# Patient Record
Sex: Female | Born: 1955 | Race: White | Hispanic: No | Marital: Married | Smoking: Current some day smoker
Health system: Southern US, Community
[De-identification: ages and names within clinical notes are randomized; demographics above are authoritative.]

## PROBLEM LIST (undated history)

## (undated) DIAGNOSIS — D125 Benign neoplasm of sigmoid colon: Secondary | ICD-10-CM

## (undated) DIAGNOSIS — T4145XA Adverse effect of unspecified anesthetic, initial encounter: Secondary | ICD-10-CM

## (undated) DIAGNOSIS — R112 Nausea with vomiting, unspecified: Secondary | ICD-10-CM

## (undated) DIAGNOSIS — Z87442 Personal history of urinary calculi: Secondary | ICD-10-CM

## (undated) DIAGNOSIS — E041 Nontoxic single thyroid nodule: Secondary | ICD-10-CM

## (undated) DIAGNOSIS — Z9889 Other specified postprocedural states: Secondary | ICD-10-CM

## (undated) DIAGNOSIS — E079 Disorder of thyroid, unspecified: Secondary | ICD-10-CM

## (undated) DIAGNOSIS — M1990 Unspecified osteoarthritis, unspecified site: Secondary | ICD-10-CM

## (undated) DIAGNOSIS — Z86010 Personal history of colonic polyps: Secondary | ICD-10-CM

## (undated) DIAGNOSIS — Z860101 Personal history of adenomatous and serrated colon polyps: Secondary | ICD-10-CM

## (undated) DIAGNOSIS — K589 Irritable bowel syndrome without diarrhea: Secondary | ICD-10-CM

## (undated) DIAGNOSIS — N189 Chronic kidney disease, unspecified: Secondary | ICD-10-CM

## (undated) DIAGNOSIS — T8859XA Other complications of anesthesia, initial encounter: Secondary | ICD-10-CM

## (undated) DIAGNOSIS — N200 Calculus of kidney: Secondary | ICD-10-CM

## (undated) DIAGNOSIS — E785 Hyperlipidemia, unspecified: Secondary | ICD-10-CM

## (undated) DIAGNOSIS — I456 Pre-excitation syndrome: Secondary | ICD-10-CM

## (undated) DIAGNOSIS — Z8489 Family history of other specified conditions: Secondary | ICD-10-CM

## (undated) DIAGNOSIS — T7840XA Allergy, unspecified, initial encounter: Secondary | ICD-10-CM

## (undated) HISTORY — DX: Allergy, unspecified, initial encounter: T78.40XA

## (undated) HISTORY — PX: HYSTEROSCOPY: SHX211

## (undated) HISTORY — DX: Disorder of thyroid, unspecified: E07.9

## (undated) HISTORY — PX: COLONOSCOPY: SHX174

## (undated) HISTORY — DX: Calculus of kidney: N20.0

## (undated) HISTORY — DX: Irritable bowel syndrome, unspecified: K58.9

## (undated) HISTORY — DX: Personal history of adenomatous and serrated colon polyps: Z86.0101

## (undated) HISTORY — PX: HEMORRHOID BANDING: SHX5850

## (undated) HISTORY — DX: Benign neoplasm of sigmoid colon: D12.5

## (undated) HISTORY — DX: Personal history of colonic polyps: Z86.010

## (undated) HISTORY — DX: Hyperlipidemia, unspecified: E78.5

## (undated) HISTORY — PX: DILATION AND CURETTAGE OF UTERUS: SHX78

---

## 1998-05-03 ENCOUNTER — Other Ambulatory Visit: Admission: RE | Admit: 1998-05-03 | Discharge: 1998-05-03 | Payer: Self-pay | Admitting: Obstetrics and Gynecology

## 2000-05-07 ENCOUNTER — Other Ambulatory Visit: Admission: RE | Admit: 2000-05-07 | Discharge: 2000-05-07 | Payer: Self-pay | Admitting: Obstetrics and Gynecology

## 2004-10-30 ENCOUNTER — Ambulatory Visit: Payer: Self-pay | Admitting: *Deleted

## 2004-11-05 ENCOUNTER — Ambulatory Visit: Payer: Self-pay | Admitting: *Deleted

## 2004-11-09 ENCOUNTER — Ambulatory Visit: Payer: Self-pay | Admitting: *Deleted

## 2004-11-16 ENCOUNTER — Ambulatory Visit: Payer: Self-pay | Admitting: *Deleted

## 2004-11-26 ENCOUNTER — Ambulatory Visit: Payer: Self-pay | Admitting: *Deleted

## 2004-12-05 ENCOUNTER — Other Ambulatory Visit: Admission: RE | Admit: 2004-12-05 | Discharge: 2004-12-05 | Payer: Self-pay | Admitting: Family Medicine

## 2004-12-05 ENCOUNTER — Ambulatory Visit: Payer: Self-pay | Admitting: Family Medicine

## 2004-12-10 ENCOUNTER — Ambulatory Visit: Payer: Self-pay | Admitting: *Deleted

## 2007-12-16 ENCOUNTER — Encounter: Payer: Self-pay | Admitting: Family Medicine

## 2008-05-09 ENCOUNTER — Encounter: Payer: Self-pay | Admitting: Family Medicine

## 2008-06-13 ENCOUNTER — Telehealth (INDEPENDENT_AMBULATORY_CARE_PROVIDER_SITE_OTHER): Payer: Self-pay | Admitting: *Deleted

## 2008-06-13 ENCOUNTER — Encounter (INDEPENDENT_AMBULATORY_CARE_PROVIDER_SITE_OTHER): Payer: Self-pay | Admitting: *Deleted

## 2008-06-13 LAB — CONVERTED CEMR LAB
Bilirubin Urine: NEGATIVE
WBC Urine, dipstick: NEGATIVE

## 2008-06-14 ENCOUNTER — Ambulatory Visit: Payer: Self-pay | Admitting: Family Medicine

## 2008-06-15 ENCOUNTER — Encounter: Payer: Self-pay | Admitting: Family Medicine

## 2008-06-16 ENCOUNTER — Ambulatory Visit: Payer: Self-pay | Admitting: Family Medicine

## 2008-06-16 DIAGNOSIS — K649 Unspecified hemorrhoids: Secondary | ICD-10-CM

## 2008-06-17 ENCOUNTER — Encounter (INDEPENDENT_AMBULATORY_CARE_PROVIDER_SITE_OTHER): Payer: Self-pay | Admitting: *Deleted

## 2008-06-17 ENCOUNTER — Encounter: Payer: Self-pay | Admitting: Family Medicine

## 2008-06-20 ENCOUNTER — Ambulatory Visit: Payer: Self-pay | Admitting: Family Medicine

## 2008-06-20 ENCOUNTER — Ambulatory Visit: Payer: Self-pay | Admitting: Gastroenterology

## 2008-06-20 LAB — CONVERTED CEMR LAB
Bilirubin Urine: NEGATIVE
Glucose, Urine, Semiquant: NEGATIVE
pH: 5

## 2008-06-21 ENCOUNTER — Encounter: Admission: RE | Admit: 2008-06-21 | Discharge: 2008-06-21 | Payer: Self-pay | Admitting: Family Medicine

## 2008-06-22 ENCOUNTER — Ambulatory Visit: Payer: Self-pay | Admitting: Gastroenterology

## 2008-06-24 ENCOUNTER — Encounter (INDEPENDENT_AMBULATORY_CARE_PROVIDER_SITE_OTHER): Payer: Self-pay | Admitting: *Deleted

## 2008-06-27 ENCOUNTER — Telehealth (INDEPENDENT_AMBULATORY_CARE_PROVIDER_SITE_OTHER): Payer: Self-pay | Admitting: *Deleted

## 2008-06-30 ENCOUNTER — Encounter: Payer: Self-pay | Admitting: Family Medicine

## 2008-07-03 LAB — CONVERTED CEMR LAB
ALT: 12 units/L (ref 0–35)
Albumin: 4 g/dL (ref 3.5–5.2)
Alkaline Phosphatase: 52 units/L (ref 39–117)
Bilirubin, Direct: 0.1 mg/dL (ref 0.0–0.3)
CO2: 31 meq/L (ref 19–32)
Chloride: 106 meq/L (ref 96–112)
Creatinine, Ser: 0.7 mg/dL (ref 0.4–1.2)
Eosinophils Absolute: 0.1 10*3/uL (ref 0.0–0.7)
GFR calc non Af Amer: 93 mL/min
HCT: 39.1 % (ref 36.0–46.0)
HDL: 58.5 mg/dL (ref >39.0)
Hemoglobin: 13.5 g/dL (ref 12.0–15.0)
Lymphocytes Relative: 30.2 % (ref 12.0–46.0)
MCHC: 34.5 g/dL (ref 30.0–36.0)
MCV: 90.9 fL (ref 78.0–100.0)
Monocytes Relative: 8.9 % (ref 3.0–12.0)
Platelets: 211 10*3/uL (ref 150–400)
RDW: 12 % (ref 11.5–14.6)
TSH: 1.76 microintl units/mL (ref 0.35–5.50)
Total Protein: 6.5 g/dL (ref 6.0–8.3)
VLDL: 19 mg/dL (ref 0–40)

## 2008-07-04 ENCOUNTER — Encounter: Admission: RE | Admit: 2008-07-04 | Discharge: 2008-07-04 | Payer: Self-pay | Admitting: Family Medicine

## 2008-07-06 ENCOUNTER — Telehealth: Payer: Self-pay | Admitting: Family Medicine

## 2008-07-14 ENCOUNTER — Ambulatory Visit: Payer: Self-pay | Admitting: Family Medicine

## 2008-07-14 DIAGNOSIS — E785 Hyperlipidemia, unspecified: Secondary | ICD-10-CM | POA: Insufficient documentation

## 2008-07-18 ENCOUNTER — Ambulatory Visit: Payer: Self-pay | Admitting: Family Medicine

## 2008-07-19 ENCOUNTER — Telehealth: Payer: Self-pay | Admitting: Gastroenterology

## 2008-07-25 ENCOUNTER — Encounter (INDEPENDENT_AMBULATORY_CARE_PROVIDER_SITE_OTHER): Payer: Self-pay | Admitting: *Deleted

## 2008-08-02 ENCOUNTER — Ambulatory Visit: Payer: Self-pay | Admitting: Family Medicine

## 2008-08-02 ENCOUNTER — Encounter (INDEPENDENT_AMBULATORY_CARE_PROVIDER_SITE_OTHER): Payer: Self-pay | Admitting: *Deleted

## 2008-08-03 ENCOUNTER — Encounter: Payer: Self-pay | Admitting: Family Medicine

## 2008-08-26 ENCOUNTER — Telehealth: Payer: Self-pay | Admitting: Family Medicine

## 2008-11-02 ENCOUNTER — Ambulatory Visit: Payer: Self-pay | Admitting: Family Medicine

## 2008-11-03 ENCOUNTER — Encounter (INDEPENDENT_AMBULATORY_CARE_PROVIDER_SITE_OTHER): Payer: Self-pay | Admitting: *Deleted

## 2008-11-14 ENCOUNTER — Encounter (INDEPENDENT_AMBULATORY_CARE_PROVIDER_SITE_OTHER): Payer: Self-pay | Admitting: *Deleted

## 2009-01-30 ENCOUNTER — Encounter: Admission: RE | Admit: 2009-01-30 | Discharge: 2009-01-30 | Payer: Self-pay | Admitting: Family Medicine

## 2009-08-16 ENCOUNTER — Encounter: Payer: Self-pay | Admitting: Family Medicine

## 2009-09-04 ENCOUNTER — Encounter: Admission: RE | Admit: 2009-09-04 | Discharge: 2009-09-04 | Payer: Self-pay | Admitting: Family Medicine

## 2009-09-04 LAB — HM MAMMOGRAPHY

## 2009-09-28 ENCOUNTER — Emergency Department (HOSPITAL_BASED_OUTPATIENT_CLINIC_OR_DEPARTMENT_OTHER): Admission: EM | Admit: 2009-09-28 | Discharge: 2009-09-29 | Payer: Self-pay | Admitting: Emergency Medicine

## 2009-09-28 ENCOUNTER — Ambulatory Visit: Payer: Self-pay | Admitting: Radiology

## 2009-11-06 ENCOUNTER — Telehealth (INDEPENDENT_AMBULATORY_CARE_PROVIDER_SITE_OTHER): Payer: Self-pay | Admitting: *Deleted

## 2009-11-06 ENCOUNTER — Ambulatory Visit: Payer: Self-pay | Admitting: Family Medicine

## 2009-11-06 DIAGNOSIS — I456 Pre-excitation syndrome: Secondary | ICD-10-CM

## 2009-11-07 ENCOUNTER — Encounter (INDEPENDENT_AMBULATORY_CARE_PROVIDER_SITE_OTHER): Payer: Self-pay | Admitting: *Deleted

## 2009-11-10 ENCOUNTER — Telehealth (INDEPENDENT_AMBULATORY_CARE_PROVIDER_SITE_OTHER): Payer: Self-pay | Admitting: *Deleted

## 2009-11-23 ENCOUNTER — Ambulatory Visit: Payer: Self-pay | Admitting: Internal Medicine

## 2009-12-18 ENCOUNTER — Encounter (INDEPENDENT_AMBULATORY_CARE_PROVIDER_SITE_OTHER): Payer: Self-pay | Admitting: *Deleted

## 2009-12-18 ENCOUNTER — Ambulatory Visit: Payer: Self-pay | Admitting: Internal Medicine

## 2009-12-26 ENCOUNTER — Telehealth: Payer: Self-pay | Admitting: Family Medicine

## 2010-01-15 ENCOUNTER — Ambulatory Visit: Payer: Self-pay | Admitting: Internal Medicine

## 2010-01-19 ENCOUNTER — Telehealth (INDEPENDENT_AMBULATORY_CARE_PROVIDER_SITE_OTHER): Payer: Self-pay | Admitting: *Deleted

## 2010-01-29 ENCOUNTER — Ambulatory Visit: Payer: Self-pay | Admitting: Internal Medicine

## 2010-01-29 LAB — HM COLONOSCOPY

## 2010-02-02 ENCOUNTER — Encounter: Payer: Self-pay | Admitting: Internal Medicine

## 2010-03-06 ENCOUNTER — Telehealth (INDEPENDENT_AMBULATORY_CARE_PROVIDER_SITE_OTHER): Payer: Self-pay | Admitting: *Deleted

## 2010-03-09 ENCOUNTER — Ambulatory Visit: Payer: Self-pay | Admitting: Family Medicine

## 2010-03-09 DIAGNOSIS — IMO0001 Reserved for inherently not codable concepts without codable children: Secondary | ICD-10-CM

## 2010-03-12 LAB — CONVERTED CEMR LAB: Total CK: 61 units/L (ref 7–177)

## 2010-03-14 ENCOUNTER — Telehealth (INDEPENDENT_AMBULATORY_CARE_PROVIDER_SITE_OTHER): Payer: Self-pay | Admitting: *Deleted

## 2010-03-14 LAB — CONVERTED CEMR LAB
Alkaline Phosphatase: 72 units/L (ref 39–117)
Cholesterol: 235 mg/dL — ABNORMAL HIGH (ref 0–200)
HDL: 75.4 mg/dL (ref >39.00)
Total Bilirubin: 0.7 mg/dL (ref 0.3–1.2)
Total Protein: 6.8 g/dL (ref 6.0–8.3)

## 2010-03-19 ENCOUNTER — Telehealth (INDEPENDENT_AMBULATORY_CARE_PROVIDER_SITE_OTHER): Payer: Self-pay | Admitting: *Deleted

## 2010-07-01 LAB — CONVERTED CEMR LAB
ALT: 29 units/L (ref 0–35)
ALT: 30 units/L (ref 0–35)
AST: 29 units/L (ref 0–37)
Alkaline Phosphatase: 76 units/L (ref 39–117)
BUN: 19 mg/dL (ref 6–23)
Bilirubin Urine: NEGATIVE
CO2: 32 meq/L (ref 19–32)
Cholesterol: 196 mg/dL (ref 0–200)
Glucose, Urine, Semiquant: NEGATIVE
HCT: 40.6 % (ref 36.0–46.0)
LDL Cholesterol: 101 mg/dL — ABNORMAL HIGH (ref 0–99)
Lymphocytes Relative: 30.6 % (ref 12.0–46.0)
Lymphs Abs: 2 10*3/uL (ref 0.7–4.0)
MCHC: 34.5 g/dL (ref 30.0–36.0)
Monocytes Relative: 8.6 % (ref 3.0–12.0)
Neutro Abs: 3.8 10*3/uL (ref 1.4–7.7)
Platelets: 221 10*3/uL (ref 150.0–400.0)
Protein, U semiquant: NEGATIVE
Sodium: 144 meq/L (ref 135–145)
Total Bilirubin: 0.5 mg/dL (ref 0.3–1.2)
Total Protein: 6.8 g/dL (ref 6.0–8.3)
Triglycerides: 88 mg/dL (ref 0.0–149.0)
WBC Urine, dipstick: NEGATIVE
pH: 5

## 2010-07-05 NOTE — Progress Notes (Signed)
Summary: leg pain from med  Phone Note Call from Patient Call back at Home Phone [phone]   Caller: Patient Summary of Call: patient was taking simvastatin she had leg pain & switched to lipitor - she is to have lab to follow up since starting   lipitor - she started getting leg pain again - wants to know if she should have lab or does she need ov  .Marland KitchenOkey Regal Spring  March 06, 2010 4:29 PM   please advise Initial call taken by: Almeta Monas CMA Duncan Dull),  March 06, 2010 4:48 PM  Follow-up for Phone Call        try half lipitor every other day ----check cpk for myalgias.   Follow-up by: Loreen Freud DO,  March 06, 2010 5:08 PM  Additional Follow-up for Phone Call Additional follow up Details #1::        Left message to call back  Almeta Monas CMA Duncan Dull)  March 07, 2010 2:59 PM     Additional Follow-up for Phone Call Additional follow up Details #2::    I spoke with patient and schedule appointment with Dr.Lowne to address muscle aches and chlosterol med. Patient aware to come fasting to appointment tomorrow so any necessary labs can be drawn Follow-up by: Shonna Chock CMA,  March 08, 2010 10:04 AM

## 2010-07-05 NOTE — Progress Notes (Signed)
Summary: REFILL REQUEST  Phone Note Refill Request Message from:  Patient on March 19, 2010 1:39 PM  Refills Requested: Medication #1:  LIPITOR 20 MG TABS Take 1 by mouth at bedtime   Dosage confirmed as above?Dosage Confirmed   Supply Requested: 1 month   Notes: PT NEEDS ASAP. DOESNT HAVE ANY PILLS [facility] [address]  Next Appointment Scheduled: NONE Initial call taken by: Lavell Islam,  March 19, 2010 1:40 PM    Prescriptions: LIPITOR 20 MG TABS (ATORVASTATIN CALCIUM) Take 1 by mouth at bedtime  #30 x 2   Entered by:   Almeta Monas CMA (AAMA)   Authorized by:   Loreen Freud DO   Signed by:   Almeta Monas CMA (AAMA) on 03/19/2010   Method used:   Faxed to ...       [facility] [phone]* (retail)       [address]       [place], [place]  [address]       Ph: [phone]       Fax: [phone]   RxID:   [id number]

## 2010-07-05 NOTE — Assessment & Plan Note (Signed)
Summary: Discuss Muscle Aches/Fasting Lab/scm   Vital Signs:  Patient profile:   55 year old female Weight:      107.0 pounds BMI:     20.29 Pulse rate:   80 / minute Pulse rhythm:   regular BP sitting:   102 / 68  (left arm) Cuff size:   small  Vitals Entered By: Almeta Monas CMA Duncan Dull) (March 09, 2010 10:10 AM) CC: c/o leg pain x1wk pt stopped lipitor   History of Present Illness: Pt here c/o leg pain since starting lipitor ---she stopped in about 1 week ago and pain is better but not gone.   no other complaints.  pain worse in R leg.  no errythema, no fevers.    Current Medications (verified): 1)  Lipitor 10 Mg Tabs (Atorvastatin Calcium) .Marland Kitchen.. 1 By Mouth Every Other Day 2)  Ultra Choice Multivitamin Kids  Chew (Pediatric Multivit-Minerals-C) .Marland Kitchen.. 1 By Mouth Once Daily 3)  Citracal Plus Heart Health 315-250-200 Mg-Unit-Mg Tabs (Calcium-Vit D3-Phytosterols) .... 2 By Mouth Once Daily 4)  Xylocaine Jelly 2 % Gel (Lidocaine Hcl) .... Apply Small Amount Every 2-3 Hours As Needed For Painful Hemorrhoids During Colon Prep  Allergies (verified): No Known Drug Allergies  Past History:  Past medical, surgical, family and social histories (including risk factors) reviewed for relevance to current acute and chronic problems.  Past Medical History: Reviewed history from 12/18/2009 and no changes required. Pre-excitation by EKG HYPERLIPIDEMIA (ICD-272.4) HEMORRHOIDS, EXTERNAL (ICD-455.3) Nephrolithiasis Irritable Bowel Syndrome  Past Surgical History: Reviewed history from 06/22/2008 and no changes required. hemorrhoids banded (Tim Earlene Plater 2005)  Family History: Reviewed history from 06/22/2008 and no changes required. Family History of Skin cancer-- BCC- F Family History of Arthritis F--pacemaker for bradycardia Family History Hypertension mat side--dementia Family History Lung cancer M --osteopenia M--colon polyps Family History High cholesterol M-- PMR,  Mac  degeneration,  heart murmur Family History Diabetes 1st degree relative F--glaucoma, cataracts, diverticulitis,  carotid enarterectomy MGM---lived to be 100    Social History: Reviewed history from 11/23/2009 and no changes required. Occupation:  housewife Married and lives in [place], [place]. Two Grown children. Current Smoker, < 1/2PPD, trying to quit Alcohol use-no Drug use-no Regular exercise-yes   Review of Systems      See HPI  Physical Exam  General:  Well-developed,well-nourished,in no acute distress; alert,appropriate and cooperative throughout examination Extremities:  No clubbing, cyanosis, edema, or deformity noted with normal full range of motion of all joints.   Skin:  Intact without suspicious lesions or rashes Psych:  Cognition and judgment appear intact. Alert and cooperative with normal attention span and concentration. No apparent delusions, illusions, hallucinations   Impression & Recommendations:  Problem # 1:  MYALGIA (ICD-729.1) stop lipitor  check cpk if negative---start lipitor 10 mg 1/2 by mouth evry other day after pain subsides  Complete Medication List: 1)  Lipitor 10 Mg Tabs (Atorvastatin calcium) .Marland Kitchen.. 1 by mouth every other day 2)  Ultra Choice Multivitamin Kids Chew (Pediatric multivit-minerals-c) .Marland Kitchen.. 1 by mouth once daily 3)  Citracal Plus Heart Health 315-250-200 Mg-unit-mg Tabs (Calcium-vit d3-phytosterols) .... 2 by mouth once daily 4)  Xylocaine Jelly 2 % Gel (Lidocaine hcl) .... Apply small amount every 2-3 hours as needed for painful hemorrhoids during colon prep  Other Orders: Venipuncture ([address]) Specimen Handling ([address]) TLB-CK Total Only(Creatine Kinase/CPK) (82550-CK) TLB-Hepatic/Liver Function Pnl (80076-HEPATIC) TLB-Lipid Panel (80061-LIPID) Prescriptions: LIPITOR 10 MG TABS (ATORVASTATIN CALCIUM) 1 by mouth every other day  #15 x 2  Entered and Authorized by:   Loreen Freud DO   Signed by:   Loreen Freud DO on 03/09/2010    Method used:   Print then Give to Patient   RxID:   [id number]

## 2010-07-05 NOTE — Letter (Signed)
Summary: New Patient letter  [facility]  [address] [place], [place] [address]   Phone: [phone]  Fax: [phone]       11/07/2009 MRN: [id number]  [facility] [address] [place], [place]  [address]  Dear Lauren Sanchez,  Welcome to the Gastroenterology Division at [facility].    You are scheduled to see Dr.  Leone Payor on 12-18-09 at 2:00p.m. on the 3rd floor at [facility], [address]  We ask that you try to arrive at our office 15 minutes prior to your appointment time to allow for check-in.  We would like you to complete the enclosed self-administered evaluation form prior to your visit and bring it with you on the day of your appointment.  We will review it with you.  Also, please bring a complete list of all your medications or, if you prefer, bring the medication bottles and we will list them.  Please bring your insurance card so that we may make a copy of it.  If your insurance requires a referral to see a specialist, please bring your referral form from your primary care physician.  Co-payments are due at the time of your visit and may be paid by cash, check or credit card.     Your office visit will consist of a consult with your physician (includes a physical exam), any laboratory testing he/she may order, scheduling of any necessary diagnostic testing (e.g. x-ray, ultrasound, CT-scan), and scheduling of a procedure (e.g. Endoscopy, Colonoscopy) if required.  Please allow enough time on your schedule to allow for any/all of these possibilities.    If you cannot keep your appointment, please call [phone] to cancel or reschedule prior to your appointment date.  This allows [place] the opportunity to schedule an appointment for another patient in need of care.  If you do not cancel or reschedule by 5 p.m. the business day prior to your appointment date, you will be charged a $50.00 late cancellation/no-show fee.    Thank you for choosing [facility] for your medical needs.  We appreciate the opportunity to care for you.  Please visit [place] at our website  to learn more about our practice.                     Sincerely,                                                             The Gastroenterology Division

## 2010-07-05 NOTE — Assessment & Plan Note (Signed)
Summary: nep/WPW/ok per kelly/jml   Visit Type:  Initial Consult Primary Provider:  Laury Axon   History of Present Illness: Lauren Sanchez is a pleasant 55 yo WF with a h/o hyperlipidemia who presents today for EP consultation regarding pre-excitation seen recently on ekg.  She recently presented to Dr Laury Axon for a routine physical.  She is active and reports doing very well. She reports rare positional dizziness if she stands too quickly, but denies other episodes. She denies symptoms of palpitations, chest pain, shortness of breath, orthopnea, PND, lower extremity edema, presyncope, syncope, or neurologic sequela. During her recent physical evaluation, she was observed to have pre-excitation on 12 lead ekg.  She therefore presents today for EP consultation.  Current Medications (verified): 1)  Lipitor 10 Mg Tabs (Atorvastatin Calcium) .... Every Other Day  Allergies (verified): No Known Drug Allergies  Past History:  Past Medical History: Pre-excitation by EKG HYPERLIPIDEMIA (ICD-272.4) HEMORRHOIDS, EXTERNAL (ICD-455.3) Nephrolithiasis  Family History: Reviewed history from 06/22/2008 and no changes required. 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 Two Grown children. Current Smoker, < 1/2PPD, trying to quit Alcohol use-no Drug use-no Regular exercise-yes   Review of Systems       All systems are reviewed and negative except as listed in the HPI.   N/V diarrhea last night,  recovering from GI illness today  Vital Signs:  Patient profile:    55 year old female Height:      61 inches Weight:      103 pounds BMI:     19.53 Pulse rate:   82 / minute BP sitting:   122 / 80  (left arm)  Vitals Entered By: Laurance Flatten CMA (November 23, 2009 4:21 PM)  Physical Exam  General:  Well developed, well nourished, in no acute distress. Head:  normocephalic and atraumatic Eyes:  PERRLA/EOM intact; conjunctiva and lids normal. Mouth:  Teeth, gums and palate normal. Oral mucosa normal. Neck:  Neck supple, no JVD. No masses, thyromegaly or abnormal cervical nodes. Lungs:  Clear bilaterally to auscultation and percussion. Heart:  Non-displaced PMI, chest non-tender; regular rate and rhythm, S1, S2 without murmurs, rubs or gallops. Carotid upstroke normal, no bruit. Normal abdominal aortic size, no bruits. Femorals normal pulses, no bruits. Pedals normal pulses. No edema, no varicosities. Abdomen:  Bowel sounds positive; abdomen soft and non-tender without masses, organomegaly, or hernias noted. No hepatosplenomegaly. Msk:  Back normal, normal gait. Muscle strength and tone normal. Pulses:  pulses normal in all 4 extremities Extremities:  No clubbing or cyanosis. Neurologic:  Alert and oriented x 3. Skin:  Intact without lesions or rashes. Cervical Nodes:  no significant adenopathy Psych:  Normal affect.   EKG  Procedure date:  11/23/2009  Findings:      sinus rhythm 82 bpm, pre-excitation  Impression & Recommendations:  Problem # 1:  WOLFF (WOLFE)-PARKINSON-WHITE (WPW) SYNDROME (ICD-426.7) The patient presents today for EP consultation regarding pre-excitation on surface ekg.  She denies symptoms of palpitations, presyncope, or syncope.  She does not appear to have WPW syndrome, though she is pre-excited on ekg. We had a long discussion today regarding pre-excitation.  She  is aware that pre-excitation carries a slight increased risk for sudden death, though this is almost always preceeded by symptoms of tachycardia/ palpitation. At  this time, I would recommend a GXT to evaluate the refractoriness/ conduction properties of her accessory pathway. She is instructed to call me if she develops symptoms of SVT/ WPW in the interim.  Problem # 2:  HYPERLIPIDEMIA (ICD-272.4)  Zocor recently stopped due to myalgias She is now tolerating lipitor 10mg  every other day  Her updated medication list for this problem includes:    Lipitor 10 Mg Tabs (Atorvastatin calcium) ..... Every other day  Her updated medication list for this problem includes:    Lipitor 10 Mg Tabs (Atorvastatin calcium) ..... Every other day  Other Orders: Treadmill (Treadmill)  Patient Instructions: 1)  Your physician recommends that you schedule a follow-up appointment in: next avaliable with Dr Johney Frame for a GXT 2)  Your physician has requested that you have an exercise tolerance test.  For further information please visit [email or web address]  Please also follow instruction sheet, as given.

## 2010-07-05 NOTE — Procedures (Signed)
Summary: Colonoscopy  Patient: [name] Note: All result statuses are Final unless otherwise noted.  Tests: (1) Colonoscopy (COL)   COL Colonoscopy           DONE     [facility]     [address]     [place], [place]  [address]           COLONOSCOPY PROCEDURE REPORT           PATIENT:  Lauren Sanchez, Lauren Sanchez  MR#:  [id number]     BIRTHDATE:  04/23/1956, 53 yrs. old  GENDER:  female     ENDOSCOPIST:  Iva Boop, MD, [facility]     REF. BY:  Loreen Freud, DO     PROCEDURE DATE:  01/29/2010     PROCEDURE:  Colonoscopy with snare polypectomy     ASA CLASS:  Class I     INDICATIONS:  Routine Risk Screening     MEDICATIONS:   Fentanyl 75 mcg IV, Versed 9 mg IV           DESCRIPTION OF PROCEDURE:   After the risks benefits and     alternatives of the procedure were thoroughly explained, informed     consent was obtained.  Digital rectal exam was performed and     revealed no abnormalities.   The LB [id number]-[id number] endoscope     was introduced through the anus and advanced to the cecum, which     was identified by both the appendix and ileocecal valve, limited     by a tortuous colon, looping difficulties.    The quality of the     prep was excellent, using MoviPrep.  The instrument was then     slowly withdrawn as the colon was fully examined with good views.     Insertion: 23:55 minutes Withdrawal: 6:47 minutes     <<PROCEDUREIMAGES>>           FINDINGS:  A diminutive polyp was found in the sigmoid colon. It     was 3 mm in size. Polyp was snared without cautery. Retrieval was     successful.   Mild diverticulosis was found in the sigmoid colon.     This was otherwise a normal examination of the colon.   Retroflexed     views in the rectum revealed internal hemorrhoids.    The scope     was then withdrawn from the patient and the procedure completed.           COMPLICATIONS:  None     ENDOSCOPIC IMPRESSION:     1) 3 mm diminutive polyp in the sigmoid colon - removed     2)  Mild diverticulosis in the sigmoid colon     3) Internal hemorrhoids     4) Otherwise normal examination of tortuous colon with excellent     prep     RECOMMENDATIONS:     Would use propofol sedation next time and consider using     gastroscope. She is small and the tortuosity along with body     habitus made passage of the scope difficult.     REPEAT EXAM:  In for Colonoscopy, pending biopsy results.           Iva Boop, MD, [facility]           CC:  Lelon Perla, DO     The Patient           n.  eSIGNED:   Iva Boop at 01/29/2010 04:18 PM           Carollee Leitz, [id number]  Note: An exclamation mark (!) indicates a result that was not dispersed into the flowsheet. Document Creation Date: 01/29/2010 4:18 PM _______________________________________________________________________  (1) Order result status: Final Collection or observation date-time: 01/29/2010 16:08 Requested date-time:  Receipt date-time:  Reported date-time:  Referring Physician:   Ordering Physician: Stan Head [phone]) Specimen Source:  Source: Launa Grill Order Number: [id number] Lab site:   Appended Document: Colonoscopy     Procedures Next Due Date:    Colonoscopy: 02/2020

## 2010-07-05 NOTE — Progress Notes (Signed)
Summary: muscle pain simvastatin  Phone Note Call from Patient Call back at Home Phone [phone]   Caller: Patient Summary of Call: pt called on simvastatin and has been experiencing muscle pain on this med .Kandice Hams  November 10, 2009 2:58 PM  Initial call taken by: Kandice Hams,  November 10, 2009 2:58 PM  Follow-up for Phone Call        stop med---  give samples of lipitor 10 mg-- 1 every other day---don't start for 2 weeks and if muscle aches do not go away don't start new meds--make ov Follow-up by: Loreen Freud DO,  November 10, 2009 3:43 PM  Additional Follow-up for Phone Call Additional follow up Details #1::        Spoke with pt informed of Dr Laury Axon recommendations and samples up front .Kandice Hams  November 10, 2009 4:28 PM  Additional Follow-up by: Kandice Hams,  November 10, 2009 4:28 PM

## 2010-07-05 NOTE — Progress Notes (Signed)
Summary: lab result   Phone Note Outgoing Call   Call placed by: [facility] CMA,  March 14, 2010 10:01 AM Details for Reason: Ideally your LDL (bad cholesterol) should be <_100___, your HDL (good cholesterol) should be >_40__ and your triglycerides should be< 150.  Diet and exercise will increase HDL and decrease the LDL and triglycerides. Read Dr. Danice Goltz book--Eat Drink and Be Healthy.  Increase Lipitor to 20 mg #30 1 by mouth  at bedtime, 2 refills.   We will recheck labs in _3__ months.  lipid, hep 272.4  Summary of Call: left message to call office...................Marland KitchenFelecia Deloach CMA  March 14, 2010 10:01 AM   Follow-up for Phone Call        Spoke with patient and she is aware to change medication. Will finish her 10mg  pills by taking 2 at night and will call when she needs a refill.  Follow-up by: Harold Barban,  March 14, 2010 10:14 AM    New/Updated Medications: LIPITOR 20 MG TABS (ATORVASTATIN CALCIUM) Take 1 by mouth at bedtime

## 2010-07-05 NOTE — Progress Notes (Signed)
Summary: Switch Provider  Phone Note Call from Patient Call back at Home Phone [phone]   Caller: Patient Call For: Dr. Christella Hartigan Reason for Call: Talk to Nurse Summary of Call: pt would like to switch from Dr. Christella Hartigan to Dr. Leone Payor b/c her husband is a pt. of Dr. Leone Payor Initial call taken by: Karna Christmas,  November 06, 2009 1:02 PM  Follow-up for Phone Call        that is ok with me if it is ok with Dr. Leone Payor Follow-up by: Rachael Fee MD,  November 06, 2009 4:22 PM  Additional Follow-up for Phone Call Additional follow up Details #1::        ok Additional Follow-up by: Iva Boop MD, [facility],  November 07, 2009 12:27 PM    Additional Follow-up for Phone Call Additional follow up Details #2::    Called pt. and L/M of the decision to switch providers and she may call back to sch'd an appt. w/Dr. Leone Payor Follow-up by: Karna Christmas,  November 07, 2009 3:46 PM

## 2010-07-05 NOTE — Letter (Signed)
Summary: Patient Notice-Hyperplastic Polyps  [facility]  [address] [place], [place] [address]   Phone: [phone]  Fax: [phone]        February 02, 2010 MRN: [id number]    [facility] [address] [place], [place]  [address]    Dear Ms. Headrick,  I am pleased to inform you that the colon polyps removed during your recent colonoscopy was found to be hyperplastic.  These types of polyps are NOT pre-cancerous.  It is therefore my recommendation that you have a repeat colonoscopy examination in 10 years for routine colorectal cancer screening.  Should you develop new or worsening symptoms of abdominal pain, bowel habit changes or bleeding from the rectum or bowels, please schedule an evaluation with either your primary care physician or with me.  Please call us if you are having persistent problems or have questions about your condition that have not been fully answered at this time.   Sincerely,  Iva Boop MD, [facility] This letter has been electronically signed by your physician.  Appended Document: Patient Notice-Hyperplastic Polyps letter mailed

## 2010-07-05 NOTE — Assessment & Plan Note (Signed)
Summary: cpx & lab/cbs   Vital Signs:  Patient profile:   55 year old female Height:      61 inches Weight:      106 pounds BMI:     20.10 Pulse rate:   82 / minute Pulse rhythm:   regular BP sitting:   94 / 68  (left arm) Cuff size:   regular  Vitals Entered By: Army Fossa CMA (November 06, 2009 8:35 AM) CC: Pt here for CPX, pt is fasting.   History of Present Illness: Pt here for cpe and labs.  Pt sees gyn for pap.    Preventive Screening-Counseling & Management  Alcohol-Tobacco     Alcohol drinks/day: 0     Smoking Status: current     Smoking Cessation Counseling: yes     Packs/Day: 6-8 cig     Year Started: 1977  off and on     Cans of tobacco/week: no     Passive Smoke Exposure: no  Caffeine-Diet-Exercise     Caffeine use/day: 1     Does Patient Exercise: yes     Type of exercise: walking     Exercise (avg: min/session): <30     Times/week: 7  Hep-HIV-STD-Contraception     HIV Risk: no     Dental Visit-last 6 months yes     Dental Care Counseling: not indicated; dental care within six months     SBE monthly: yes  Safety-Violence-Falls     Seat Belt Use: 100      Sexual History:  currently monogamous.    Current Medications (verified): 1)  Zocor 40 Mg Tabs (Simvastatin) .Marland Kitchen.. 1 By Mouth At Bedtime  Allergies (verified): No Known Drug Allergies  Past History:  Past Medical History: Last updated: 08/02/2008 Hyperlipidemia  Past Surgical History: Last updated: 06/22/2008 hemorrhoids banded [place] 2005)  Family History: Last updated: 06/22/2008 Family History of Skin cancer-- BCC- F Family History of Arthritis F--pacemaker for bradycardia Family History Hypertension mat side--dementia Family History Lung cancer M --osteopenia M--colon polyps Family History High cholesterol M-- PMR,  Mac degeneration,  heart murmur Family History Diabetes 1st degree relative F--glaucoma, cataracts, diverticulitis,  carotid enarterectomy MGM---lived to  be 100    Social History: Last updated: 06/22/2008 Occupation:  none Married Current Smoker Alcohol use-no Drug use-no Regular exercise-yes   Risk Factors: Alcohol Use: 0 (11/06/2009) Caffeine Use: 1 (11/06/2009) Exercise: yes (11/06/2009)  Risk Factors: Smoking Status: current (11/06/2009) Packs/Day: 6-8 cig (11/06/2009) Cans of tobacco/wk: no (11/06/2009) Passive Smoke Exposure: no (11/06/2009)  Family History: Reviewed history from 06/22/2008 and no changes required. Family History of Skin cancer-- BCC- F Family History of Arthritis F--pacemaker for bradycardia Family History Hypertension mat side--dementia Family History Lung cancer M --osteopenia M--colon polyps Family History High cholesterol M-- PMR,  Mac degeneration,  heart murmur Family History Diabetes 1st degree relative F--glaucoma, cataracts, diverticulitis,  carotid enarterectomy MGM---lived to be 100    Social History: Reviewed history from 06/22/2008 and no changes required. Occupation:  none Married Current Smoker Alcohol use-no Drug use-no Regular exercise-yes  Caffeine use/day:  1 Dental Care w/in 6 mos.:  yes Sexual History:  currently monogamous  Review of Systems      See HPI General:  Denies chills, fatigue, fever, loss of appetite, malaise, sleep disorder, sweats, weakness, and weight loss. Eyes:  Denies blurring, discharge, double vision, eye irritation, eye pain, halos, itching, light sensitivity, red eye, vision loss-1 eye, and vision loss-both eyes; optho q1y. ENT:  Denies  decreased hearing, difficulty swallowing, ear discharge, earache, hoarseness, nasal congestion, nosebleeds, postnasal drainage, ringing in ears, sinus pressure, and sore throat. CV:  Denies bluish discoloration of lips or nails, chest pain or discomfort, difficulty breathing at night, difficulty breathing while lying down, fainting, fatigue, leg cramps with exertion, lightheadness, near fainting, palpitations,  shortness of breath with exertion, swelling of feet, swelling of hands, and weight gain. Resp:  Denies chest discomfort, chest pain with inspiration, cough, coughing up blood, excessive snoring, hypersomnolence, morning headaches, pleuritic, shortness of breath, sputum productive, and wheezing. GI:  Complains of hemorrhoids; denies abdominal pain, bloody stools, change in bowel habits, constipation, dark tarry stools, diarrhea, excessive appetite, gas, indigestion, loss of appetite, nausea, vomiting, vomiting blood, and yellowish skin color. GU:  Denies abnormal vaginal bleeding, decreased libido, discharge, dysuria, genital sores, hematuria, incontinence, nocturia, urinary frequency, and urinary hesitancy. MS:  Denies joint pain, joint redness, joint swelling, loss of strength, low back pain, mid back pain, muscle aches, muscle , cramps, muscle weakness, stiffness, and thoracic pain. Derm:  Denies changes in color of skin, changes in nail beds, dryness, excessive perspiration, flushing, hair loss, insect bite(s), itching, lesion(s), poor wound healing, and rash. Neuro:  Denies brief paralysis, difficulty with concentration, disturbances in coordination, falling down, headaches, inability to speak, memory loss, numbness, poor balance, seizures, sensation of room spinning, tingling, tremors, visual disturbances, and weakness. Psych:  Denies alternate hallucination ( auditory/visual), anxiety, depression, easily angered, easily tearful, irritability, mental problems, panic attacks, sense of great danger, suicidal thoughts/plans, thoughts of violence, unusual visions or sounds, and thoughts /plans of harming others. Endo:  Denies cold intolerance, excessive hunger, excessive thirst, excessive urination, heat intolerance, polyuria, and weight change. Heme:  Denies abnormal bruising, bleeding, enlarge lymph nodes, fevers, pallor, and skin discoloration. Allergy:  Denies hives or rash, itching eyes, persistent  infections, seasonal allergies, and sneezing.  Physical Exam  General:  Well-developed,well-nourished,in no acute distress; alert,appropriate and cooperative throughout examination Head:  Normocephalic and atraumatic without obvious abnormalities. No apparent alopecia or balding. Eyes:  vision grossly intact, pupils equal, pupils round, pupils reactive to light, and no injection.   Ears:  External ear exam shows no significant lesions or deformities.  Otoscopic examination reveals clear canals, tympanic membranes are intact bilaterally without bulging, retraction, inflammation or discharge. Hearing is grossly normal bilaterally. Nose:  External nasal examination shows no deformity or inflammation. Nasal mucosa are pink and moist without lesions or exudates. Mouth:  Oral mucosa and oropharynx without lesions or exudates.  Teeth in good repair. Neck:  No deformities, masses, or tenderness noted. Chest Wall:  No deformities, masses, or tenderness noted. Breasts:  gyn Lungs:  Normal respiratory effort, chest expands symmetrically. Lungs are clear to auscultation, no crackles or wheezes. Heart:  normal rate and no murmur.   Abdomen:  Bowel sounds positive,abdomen soft and non-tender without masses, organomegaly or hernias noted. Rectal:  gyn Genitalia:  gyn Msk:  normal ROM, no joint tenderness, no joint swelling, no joint warmth, no redness over joints, no joint deformities, no joint instability, and no crepitation.   Pulses:  R posterior tibial normal, R dorsalis pedis normal, R carotid normal, L posterior tibial normal, L dorsalis pedis normal, and L carotid normal.   Extremities:  No clubbing, cyanosis, edema, or deformity noted with normal full range of motion of all joints.   Neurologic:  alert & oriented X3, strength normal in all extremities, and gait normal.   Skin:  Intact without suspicious lesions or rashes Cervical  Nodes:  No lymphadenopathy noted Axillary Nodes:  No palpable  lymphadenopathy Psych:  Cognition and judgment appear intact. Alert and cooperative with normal attention span and concentration. No apparent delusions, illusions, hallucinations   Impression & Recommendations:  Problem # 1:  PREVENTIVE HEALTH CARE (ICD-V70.0) GHM utd pap/mammo per gyn Orders: Venipuncture ([address]) TLB-Lipid Panel (80061-LIPID) TLB-BMP (Basic Metabolic Panel-BMET) (80048-METABOL) TLB-CBC Platelet - w/Differential (85025-CBCD) TLB-Hepatic/Liver Function Pnl (80076-HEPATIC) TLB-TSH (Thyroid Stimulating Hormone) (84443-TSH) Gastroenterology Referral (GI) EKG w/ Interpretation (93000)  Problem # 2:  WOLFF (WOLFE)-PARKINSON-WHITE (WPW) SYNDROME (ICD-426.7) Assessment: Unchanged  Orders: Cardiology Referral (Cardiology) EKG w/ Interpretation ([address])  Problem # 3:  HYPERLIPIDEMIA (ICD-272.4)  Her updated medication list for this problem includes:    Zocor 40 Mg Tabs (Simvastatin) .Marland Kitchen... 1 by mouth at bedtime  Orders: Venipuncture ([address]) TLB-Lipid Panel (80061-LIPID) TLB-BMP (Basic Metabolic Panel-BMET) (80048-METABOL) TLB-CBC Platelet - w/Differential (85025-CBCD) TLB-Hepatic/Liver Function Pnl (80076-HEPATIC) TLB-TSH (Thyroid Stimulating Hormone) (84443-TSH) EKG w/ Interpretation (93000)  Labs Reviewed: SGOT: 26 (11/02/2008)   SGPT: 29 (11/02/2008)   HDL:58.5 (06/20/2008)  LDL:DEL (06/20/2008)  Chol:257 (06/20/2008)  Trig:94 (06/20/2008)  Problem # 4:  HEMORRHOIDS, EXTERNAL (ICD-455.3) Assessment: Deteriorated  Orders: Gastroenterology Referral (GI)  Complete Medication List: 1)  Zocor 40 Mg Tabs (Simvastatin) .Marland Kitchen.. 1 by mouth at bedtime  Other Orders: Specimen Handling (09811) T-Culture, Urine (91478-29562) UA Dipstick w/o Micro (manual) (13086)   EKG  Procedure date:  11/06/2009  Findings:      Normal sinus rhythm with rate of:  72 bpm   EKG  Procedure date:  11/06/2009  Findings:      WPW     Laboratory Results   Urine  Tests   Date/Time Reported: November 06, 2009 10:37 AM   Routine Urinalysis   Color: orange Appearance: Clear Glucose: negative   (Normal Range: Negative) Bilirubin: negative   (Normal Range: Negative) Ketone: negative   (Normal Range: Negative) Spec. Gravity: >=1.030   (Normal Range: 1.003-1.035) Blood: moderate   (Normal Range: Negative) pH: 5.0   (Normal Range: 5.0-8.0) Protein: negative   (Normal Range: Negative) Urobilinogen: negative   (Normal Range: 0-1) Nitrite: negative   (Normal Range: Negative) Leukocyte Esterace: negative   (Normal Range: Negative)    Comments: Floydene Flock  November 06, 2009 10:37 AM cx sent

## 2010-07-05 NOTE — Progress Notes (Signed)
Summary: med concerns  ---- Converted from flag ---- ---- 12/26/2009 7:58 AM, Shonna Chock CMA wrote: Message left on VM  yesterday after 4:15pm, unable to create phone note due to patient not leaving a DOB and unable to fully make out her name: ? Lauren Sanchez. [phone], please call to discuss lipitor. Patient was given samples and will run out in 1 week-? continue, ? when next labs due ------------------------------   left message to call office.Felecia Deloach CMA  December 26, 2009 11:01 AM    pt states  that she was instructed to take lipitor 10 mg-- 1 every other day in june but want to know when she needs to have labs check and if she needs to increase to everyday or keep the same. pt states that she is currently not having any reaction to med.Felecia Deloach CMA  December 26, 2009 2:30 PM   Pt needs to con't every other day---- recheck labs in August.   yrlowne 7.26/2011 230p  DISCUSS WITH PATIENT.Felecia Deloach CMA  December 27, 2009 9:36 AM

## 2010-07-05 NOTE — Letter (Signed)
Summary: [facility] Instructions  [facility]  [address] [place], [place] [address]   Phone: [phone]  Fax: [phone]       Lauren Sanchez    13-Jul-1955    MRN: [id number]      Procedure Day Dorna Bloom: Duanne Limerick, 01/29/10     Arrival Time: 2:30 PM       Procedure Time: 3:30 PM    Location of Procedure:                    _X_  [facility] (4th Floor)                    PREPARATION FOR COLONOSCOPY WITH MOVIPREP   Starting 5 days prior to your procedure 01/24/10 do not eat nuts, seeds, popcorn, corn, beans, peas,  salads, or any raw vegetables.  Do not take any fiber supplements (e.g. Metamucil, Citrucel, and Benefiber).  THE DAY BEFORE YOUR PROCEDURE         SUNDAY, 01/28/10  1.  Drink clear liquids the entire day-NO SOLID FOOD  2.  Do not drink anything colored red or purple.  Avoid juices with pulp.  No orange juice.  3.  Drink at least 64 oz. (8 glasses) of fluid/clear liquids during the day to prevent dehydration and help the prep work efficiently.  CLEAR LIQUIDS INCLUDE: Water Jello Ice Popsicles Tea (sugar ok, no milk/cream) Powdered fruit flavored drinks Coffee (sugar ok, no milk/cream) Gatorade Juice: apple, white grape, white cranberry  Lemonade Clear bullion, consomm, broth Carbonated beverages (any kind) Strained chicken noodle soup Hard Candy                             4.  In the morning, mix first dose of MoviPrep solution:    Empty 1 Pouch A and 1 Pouch B into the disposable container    Add lukewarm drinking water to the top line of the container. Mix to dissolve    Refrigerate (mixed solution should be used within 24 hrs)  5.  Begin drinking the prep at 5:00 p.m. The MoviPrep container is divided by 4 marks.   Every 15 minutes drink the solution down to the next mark (approximately 8 oz) until the full liter is complete.   6.  Follow completed prep with 16 oz of clear liquid of your choice (Nothing red or purple).  Continue  to drink clear liquids until bedtime.  7.  Before going to bed, mix second dose of MoviPrep solution:    Empty 1 Pouch A and 1 Pouch B into the disposable container    Add lukewarm drinking water to the top line of the container. Mix to dissolve    Refrigerate  THE DAY OF YOUR PROCEDURE      MONDAY, 01/29/10  Beginning at 10:30 a.m. (5 hours before procedure):         1. Every 15 minutes, drink the solution down to the next mark (approx 8 oz) until the full liter is complete.  2. Follow completed prep with 16 oz. of clear liquid of your choice.    3. You may drink clear liquids until 1:30 PM (2 HOURS BEFORE PROCEDURE).  MEDICATION INSTRUCTIONS  Unless otherwise instructed, you should take regular prescription medications with a small sip of water   as early as possible the morning of your procedure.       OTHER INSTRUCTIONS  You  will need a responsible adult at least 55 years of age to accompany you and drive you home.   This person must remain in the waiting room during your procedure.  Wear loose fitting clothing that is easily removed.  Leave jewelry and other valuables at home.  However, you may wish to bring a book to read or  an iPod/MP3 player to listen to music as you wait for your procedure to start.  Remove all body piercing jewelry and leave at home.  Total time from sign-in until discharge is approximately 2-3 hours.  You should go home directly after your procedure and rest.  You can resume normal activities the  day after your procedure.  The day of your procedure you should not:   Drive   Make legal decisions   Operate machinery   Drink alcohol   Return to work  You will receive specific instructions about eating, activities and medications before you leave.  The above instructions have been reviewed and explained to me by   _______________________  I fully understand and can verbalize these instructions _____________________________ Date  _________

## 2010-07-05 NOTE — Assessment & Plan Note (Signed)
Summary: discuss colon//hemorroids--ch.   History of Present Illness Visit Type: Initial Visit Primary GI MD: Rob Bunting MD Primary Provider: Rosana Fret Requesting Provider: Loreen Freud, MD Chief Complaint: Discuss having colonoscopy History of Present Illness:   55 yo ww with chronuic hemorrhoid problems and has never had a screening colonoscopy. She has had yeasr of intermittent swelling, bleeding and prorusion of hemorrhoids. She tried therapy with Analpram last year but did not follow-through with colonoscopy due to concerns over hemorrhoidal irritation and bleeding.  She did have hemorrhoidal ligation at one time in the past with good result but only lasted 3 months.     GI Review of Systems      Denies abdominal pain, acid reflux, belching, bloating, chest pain, dysphagia with liquids, dysphagia with solids, heartburn, loss of appetite, nausea, vomiting, vomiting blood, weight loss, and  weight gain.      Reports hemorrhoids, irritable bowel syndrome, rectal bleeding, and  rectal pain.     Denies anal fissure, black tarry stools, change in bowel habit, constipation, diarrhea, diverticulosis, fecal incontinence, heme positive stool, jaundice, light color stool, and  liver problems.    Current Medications (verified): 1)  Lipitor 10 Mg Tabs (Atorvastatin Calcium) .... Every Other Day 2)  Ultra Choice Multivitamin Kids  Chew (Pediatric Multivit-Minerals-C) .Marland Kitchen.. 1 By Mouth Once Daily 3)  Citracal Plus Heart Health [phone] Mg-Unit-Mg Tabs (Calcium-Vit D3-Phytosterols) .... 2 By Mouth Once Daily  Allergies (verified): No Known Drug Allergies  Past History:  Past Medical History: Pre-excitation by EKG HYPERLIPIDEMIA (ICD-272.4) HEMORRHOIDS, EXTERNAL (ICD-455.3) Nephrolithiasis Irritable Bowel Syndrome  Past Surgical History: Reviewed history from 06/22/2008 and no changes required. hemorrhoids banded ([facility] 2005)  Family History: Reviewed history from  06/22/2008 and no changes required. Family History of Skin cancer-- BCC- F Family History of Arthritis F--pacemaker for bradycardia Family History Hypertension mat side--dementia Family History Lung cancer M --osteopenia M--colon polyps Family History High cholesterol M-- PMR,  Mac degeneration,  heart murmur Family History Diabetes 1st degree relative F--glaucoma, cataracts, diverticulitis,  carotid enarterectomy MGM---lived to be 100    Social History: Reviewed history from 11/23/2009 and no changes required. Occupation:  housewife Married and lives in [place], [place]. Two Grown children. Current Smoker, < 1/2PPD, trying to quit Alcohol use-no Drug use-no Regular exercise-yes   Review of Systems       The patient complains of heart rhythm changes and muscle pains/cramps.         WPW  Vital Signs:  Patient profile:   55 year old female Height:      61 inches Weight:      105 pounds BMI:     19.91 BSA:     1.44 Pulse rate:   80 / minute Pulse rhythm:   regular BP sitting:   82 / 58  (left arm)  Vitals Entered By: Merri Ray CMA Duncan Dull) (December 18, 2009 2:22 PM)  Physical Exam  General:  Well developed, well nourished, no acute distress. Lungs:  Clear throughout to auscultation. Heart:  Regular rate and rhythm; no murmurs, rubs,  or bruits. Abdomen:  Soft, nontender and nondistended. No masses, hepatosplenomegaly or hernias noted. Normal bowel sounds. Rectal:  inspected - prolapsed hemorrhoids female staff present   Impression & Recommendations:  Problem # 1:  HEMORRHOIDS, WITH BLEEDING (ICD-455.8) Assessment Deteriorated surgery vs. banding, will try to accomplish screening colonoscopy first I think PPH surgery may be appropriate for her and we discussed, and a handout was provided. ?'s answered xylocaine jelly  during prep to help with pain.  Problem # 2:  SCREENING COLORECTAL-CANCER (ICD-V76.51) Assessment: New Risks, benefits,and indications of  endoscopic procedure(s) were reviewed with the patient and all questions answered. Will double check to see if any WPW issues i.e possible hypokalemia could ba an issue for her and trigger a problem. Seems unlikely but will query Dr. Johney Frame Orders: Colonoscopy (Colon)  Problem # 3:  WOLFF (WOLFE)-PARKINSON-WHITE (WPW) SYNDROME (ICD-426.7) Assessment: Comment Only will ask Dr. Johney Frame re any precautions with prep having GXT Aug 15  Patient Instructions: 1)  Please pick up your medications at your pharmacy. MOVIPREP, XYLOCAINE 2)  We will see you at your procedure on 01/29/10 3)  [facility] Patient Information Guide given to patient.  4)  Colonoscopy and Flexible Sigmoidoscopy brochure given.  5)  Hemorrhoids brochure given.  6)  Copy sent to : Hillis Range, MD 7)  The medication list was reviewed and reconciled.  All changed / newly prescribed medications were explained.  A complete medication list was provided to the patient / caregiver. Prescriptions: MOVIPREP 100 GM  SOLR (PEG-KCL-NACL-NASULF-NA ASC-C) As per prep instructions.  #1 x 0   Entered by:   Francee Piccolo CMA (AAMA)   Authorized by:   Iva Boop MD, [facility]   Signed by:   Francee Piccolo CMA (AAMA) on 12/18/2009   Method used:   [facility] to        [facility] [phone]* (retail)       [address]       [place], [place]  [address]       Ph: [phone]       Fax: [phone]   RxID:   [id number] XYLOCAINE JELLY 2 % GEL (LIDOCAINE HCL) apply small amount every 2-3 hours as needed for painful hemorrhoids during colon prep  #30 grams x 0   Entered and Authorized by:   Iva Boop MD, [facility]   Signed by:   Iva Boop MD, FACG on 12/18/2009   Method used:   [facility] to        [facility] [phone]* (retail)       [address]       [place], [place]  [address]       Ph: [phone]       Fax: [phone]   RxID:    [id number]   Appended Document: discuss colon//hemorroids--ch. I would recommend routine GI prep, without modification due to pre-excitation seen on EKG  Appended Document: discuss colon//hemorroids--ch. let her know what Dr. Claudia Desanctis oked routine prep  Appended Document: discuss colon//hemorroids--ch. patient aware

## 2010-07-05 NOTE — Progress Notes (Signed)
Summary: SAMPLES  Phone Note Call from Patient   Caller: Patient Summary of Call: PT HUSBAND CAME IN ASK TO PICK UP HER LIPTOR 10 MG Initial call taken by: Freddy Jaksch,  January 19, 2010 11:18 AM  Follow-up for Phone Call        SAMPLE WAS GIVEN TO HUSBAND WHILE WAITING ON HIS SON Follow-up by: Freddy Jaksch,  January 19, 2010 11:26 AM

## 2010-08-21 LAB — URINE CULTURE: Colony Count: 45000

## 2010-08-21 LAB — URINALYSIS, ROUTINE W REFLEX MICROSCOPIC
Bilirubin Urine: NEGATIVE
Glucose, UA: NEGATIVE mg/dL
Ketones, ur: NEGATIVE mg/dL
Nitrite: NEGATIVE
Specific Gravity, Urine: 1.022 (ref 1.005–1.030)
Urobilinogen, UA: 1 mg/dL (ref 0.0–1.0)

## 2010-08-21 LAB — URINE MICROSCOPIC-ADD ON

## 2010-08-23 ENCOUNTER — Other Ambulatory Visit: Payer: Self-pay | Admitting: Family Medicine

## 2010-08-24 MED ORDER — ATORVASTATIN CALCIUM 20 MG PO TABS: 20.0000 mg | ORAL_TABLET | Freq: Every day | ORAL | Status: DC

## 2010-09-04 ENCOUNTER — Other Ambulatory Visit: Payer: Self-pay

## 2010-09-11 ENCOUNTER — Other Ambulatory Visit (INDEPENDENT_AMBULATORY_CARE_PROVIDER_SITE_OTHER): Payer: BLUE CROSS/BLUE SHIELD

## 2010-09-11 DIAGNOSIS — E785 Hyperlipidemia, unspecified: Secondary | ICD-10-CM

## 2010-09-11 LAB — HEPATIC FUNCTION PANEL
ALT: 27 U/L (ref 0–35)
Alkaline Phosphatase: 67 U/L (ref 39–117)
Bilirubin, Direct: 0.1 mg/dL (ref 0.0–0.3)

## 2010-09-11 LAB — LIPID PANEL
Cholesterol: 176 mg/dL (ref 0–200)
HDL: 69.5 mg/dL (ref >39.00)
LDL Cholesterol: 90 mg/dL (ref 0–99)
Total CHOL/HDL Ratio: 3
Triglycerides: 84 mg/dL (ref 0.0–149.0)

## 2010-09-12 ENCOUNTER — Encounter: Payer: Self-pay | Admitting: *Deleted

## 2010-10-03 ENCOUNTER — Other Ambulatory Visit: Payer: Self-pay

## 2010-10-03 MED ORDER — ATORVASTATIN CALCIUM 20 MG PO TABS: 20.0000 mg | ORAL_TABLET | Freq: Every day | ORAL | Status: DC

## 2011-01-17 ENCOUNTER — Other Ambulatory Visit: Payer: Self-pay | Admitting: Family Medicine

## 2011-01-17 DIAGNOSIS — Z1231 Encounter for screening mammogram for malignant neoplasm of breast: Secondary | ICD-10-CM

## 2011-01-18 ENCOUNTER — Ambulatory Visit
Admission: RE | Admit: 2011-01-18 | Discharge: 2011-01-18 | Disposition: A | Discharge status: Home / Self Care | Payer: BLUE CROSS/BLUE SHIELD | Source: Ambulatory Visit | Attending: Family Medicine | Admitting: Family Medicine

## 2011-01-18 DIAGNOSIS — Z1231 Encounter for screening mammogram for malignant neoplasm of breast: Secondary | ICD-10-CM

## 2011-04-29 ENCOUNTER — Other Ambulatory Visit: Payer: Self-pay | Admitting: Family Medicine

## 2011-04-29 MED ORDER — ATORVASTATIN CALCIUM 20 MG PO TABS: 20.0000 mg | ORAL_TABLET | Freq: Every day | ORAL | Status: DC

## 2011-04-29 NOTE — Telephone Encounter (Signed)
Letter mailed to schedule CPX- KP

## 2011-04-29 NOTE — Telephone Encounter (Signed)
The system is down and Rx has been re-faxed Manually     KP

## 2011-04-29 NOTE — Telephone Encounter (Signed)
Addended by: Arnette Norris on: 04/29/2011 04:23 PM   Modules accepted: Orders

## 2011-07-05 ENCOUNTER — Other Ambulatory Visit: Payer: Self-pay | Admitting: Family Medicine

## 2011-07-05 DIAGNOSIS — E785 Hyperlipidemia, unspecified: Secondary | ICD-10-CM

## 2011-07-08 ENCOUNTER — Other Ambulatory Visit (INDEPENDENT_AMBULATORY_CARE_PROVIDER_SITE_OTHER): Payer: BC Managed Care – PPO

## 2011-07-08 DIAGNOSIS — E785 Hyperlipidemia, unspecified: Secondary | ICD-10-CM

## 2011-07-08 LAB — LIPID PANEL
HDL: 65.9 mg/dL (ref >39.00)
LDL Cholesterol: 91 mg/dL (ref 0–99)
VLDL: 19.6 mg/dL (ref 0.0–40.0)

## 2011-07-09 LAB — HEPATIC FUNCTION PANEL
ALT: 24 U/L (ref 0–35)
Total Bilirubin: 0.4 mg/dL (ref 0.3–1.2)

## 2011-07-15 ENCOUNTER — Encounter: Payer: Self-pay | Admitting: Family Medicine

## 2011-07-15 ENCOUNTER — Ambulatory Visit (INDEPENDENT_AMBULATORY_CARE_PROVIDER_SITE_OTHER): Payer: BC Managed Care – PPO | Admitting: Family Medicine

## 2011-07-15 VITALS — BP 108/70 | HR 82 | Temp 98.3°F | Ht 61.0 in | Wt 110.2 lb

## 2011-07-15 DIAGNOSIS — E785 Hyperlipidemia, unspecified: Secondary | ICD-10-CM

## 2011-07-15 DIAGNOSIS — Z0000 Encounter for general adult medical examination without abnormal findings: Secondary | ICD-10-CM

## 2011-07-15 LAB — POCT URINALYSIS DIPSTICK: Glucose, UA: NEGATIVE

## 2011-07-15 MED ORDER — ATORVASTATIN CALCIUM 20 MG PO TABS: 20.0000 mg | ORAL_TABLET | Freq: Every day | ORAL | Status: DC

## 2011-07-15 MED ORDER — VALACYCLOVIR HCL 1 G PO TABS: 1000.0000 mg | ORAL_TABLET | Freq: Two times a day (BID) | ORAL | Status: DC

## 2011-07-15 NOTE — Patient Instructions (Signed)

## 2011-07-15 NOTE — Progress Notes (Signed)
  Subjective:    Lauren Sanchez is a 56 y.o. female here for follow up of dyslipidemia. The patient does not use medications that may worsen dyslipidemias (corticosteroids, progestins, anabolic steroids, diuretics, beta-blockers, amiodarone, cyclosporine, olanzapine). The patient exercises rarely. The patient is not known to have coexisting coronary artery disease.   Cardiac Risk Factors Age > 45-female, > 55-female:  YES  +1  Smoking:   YES  +1  Sig. family hx of CHD*:  YES  +1  Hypertension:   NO  Diabetes:   NO  HDL < 35:   NO  HDL > 59:   YES  -1  Total: 2   *- Sig. family h/o CHD per NCEP = MI or sudden death at <55yo in  father or other 1st-degree female relative, or <65yo in mother or  other 1st-degree female relative  The following portions of the patient's history were reviewed and updated as appropriate: allergies, current medications, past family history, past medical history, past social history, past surgical history and problem list.  Review of Systems Pertinent items are noted in HPI.    Objective:    BP 108/70  Pulse 82  Temp(Src) 98.3 F (36.8 C) (Oral)  Ht 5\' 1"  (1.549 m)  Wt 110 lb 3.2 oz (49.986 kg)  BMI 20.82 kg/m2  SpO2 99% General appearance: alert, cooperative, appears stated age and no distress Neck: no adenopathy, no carotid bruit, no JVD, supple, symmetrical, trachea midline and thyroid not enlarged, symmetric, no tenderness/mass/nodules Lungs: clear to auscultation bilaterally Heart: S1, S2 normal  Lab Review Lab Results  Component Value Date   CHOL 176 07/08/2011   CHOL 176 09/11/2010   CHOL 235* 03/09/2010   HDL 65.90 07/08/2011   HDL 69.50 09/11/2010   HDL 16.10 03/09/2010   LDLDIRECT 137.9 03/09/2010   LDLDIRECT 174.1 06/20/2008      Assessment:    Dyslipidemia as detailed above with 2 CHD risk factors using NCEP scheme above.  Target levels for LDL are: < 100 mg/dl (CHD or "CHD risk equivalent" is present)  Explained to the patient the  respective contributions of genetics, diet, and exercise to lipid levels and the use of medication in severe cases which do not respond to lifestyle alteration. The patient's interest and motivation in making lifestyle changes seems good.    Plan:    The following changes are planned for the next 6 months, at which time the patient will return for repeat fasting lipids:  1. Dietary changes: Increase soluble fiber Plant sterols 2grams per day (e.g. Benecol): yes Reduce saturated fat, "trans" monounsaturated fatty acids, and cholesterol 2. Exercise changes:  Inc walking 3. Other treatment: stop smoking 4. Lipid-lowering medications: lipitor   (Recommended by NCEP after 3-6 mos of dietary therapy & lifestyle modification,  except if CHD is present or LDL well above 190.) 5. Hormone replacement therapy (patient is a postmenopausal  woman): no 6. Screening for secondary causes of dyslipidemias: None indicated 7. Lipid screening for relatives: na 8. Follow up: a few months.  Note: The majority of the visit was spent in counseling on the pathophysiology and treatment of dyslipidemias.

## 2011-07-16 LAB — CBC WITH DIFFERENTIAL/PLATELET
Basophils Relative: 0.6 % (ref 0.0–3.0)
Eosinophils Relative: 1.1 % (ref 0.0–5.0)
Hemoglobin: 13.4 g/dL (ref 12.0–15.0)
Lymphocytes Relative: 39.6 % (ref 12.0–46.0)
MCV: 90.5 fl (ref 78.0–100.0)
Neutrophils Relative %: 51.4 % (ref 43.0–77.0)
RBC: 4.45 Mil/uL (ref 3.87–5.11)
WBC: 7.8 10*3/uL (ref 4.5–10.5)

## 2011-07-16 LAB — BASIC METABOLIC PANEL
Calcium: 9.7 mg/dL (ref 8.4–10.5)
Chloride: 104 mEq/L (ref 96–112)
Creatinine, Ser: 0.6 mg/dL (ref 0.4–1.2)
Sodium: 141 mEq/L (ref 135–145)

## 2011-07-16 LAB — TSH: TSH: 1.36 u[IU]/mL (ref 0.35–5.50)

## 2011-08-26 ENCOUNTER — Ambulatory Visit (INDEPENDENT_AMBULATORY_CARE_PROVIDER_SITE_OTHER): Payer: BC Managed Care – PPO | Admitting: Family Medicine

## 2011-08-26 ENCOUNTER — Encounter: Payer: Self-pay | Admitting: Family Medicine

## 2011-08-26 ENCOUNTER — Other Ambulatory Visit (HOSPITAL_COMMUNITY)
Admission: RE | Admit: 2011-08-26 | Discharge: 2011-08-26 | Disposition: A | Discharge status: Home / Self Care | Payer: BC Managed Care – PPO | Source: Ambulatory Visit | Attending: Family Medicine | Admitting: Family Medicine

## 2011-08-26 VITALS — BP 108/72 | HR 88 | Temp 98.1°F | Ht 61.0 in | Wt 111.0 lb

## 2011-08-26 DIAGNOSIS — E049 Nontoxic goiter, unspecified: Secondary | ICD-10-CM

## 2011-08-26 DIAGNOSIS — Z01419 Encounter for gynecological examination (general) (routine) without abnormal findings: Secondary | ICD-10-CM | POA: Insufficient documentation

## 2011-08-26 DIAGNOSIS — Z0000 Encounter for general adult medical examination without abnormal findings: Secondary | ICD-10-CM

## 2011-08-26 DIAGNOSIS — Z124 Encounter for screening for malignant neoplasm of cervix: Secondary | ICD-10-CM

## 2011-08-26 DIAGNOSIS — Z780 Asymptomatic menopausal state: Secondary | ICD-10-CM

## 2011-08-26 DIAGNOSIS — E785 Hyperlipidemia, unspecified: Secondary | ICD-10-CM

## 2011-08-26 DIAGNOSIS — Z23 Encounter for immunization: Secondary | ICD-10-CM

## 2011-08-26 DIAGNOSIS — Z1239 Encounter for other screening for malignant neoplasm of breast: Secondary | ICD-10-CM

## 2011-08-26 NOTE — Progress Notes (Signed)
Addended by: Arnette Norris on: 08/26/2011 03:58 PM   Modules accepted: Orders

## 2011-08-26 NOTE — Assessment & Plan Note (Signed)
con't meds  Check labs 

## 2011-08-26 NOTE — Patient Instructions (Signed)
Preventive Care for Adults, Female A healthy lifestyle and preventive care can promote health and wellness. Preventive health guidelines for women include the following key practices.  A routine yearly physical is a good way to check with your caregiver about your health and preventive screening. It is a chance to share any concerns and updates on your health, and to receive a thorough exam.   Visit your dentist for a routine exam and preventive care every 6 months. Brush your teeth twice a day and floss once a day. Good oral hygiene prevents tooth decay and gum disease.   The frequency of eye exams is based on your age, health, family medical history, use of contact lenses, and other factors. Follow your caregiver's recommendations for frequency of eye exams.   Eat a healthy diet. Foods like vegetables, fruits, whole grains, low-fat dairy products, and lean protein foods contain the nutrients you need without too many calories. Decrease your intake of foods high in solid fats, added sugars, and salt. Eat the right amount of calories for you.Get information about a proper diet from your caregiver, if necessary.   Regular physical exercise is one of the most important things you can do for your health. Most adults should get at least 150 minutes of moderate-intensity exercise (any activity that increases your heart rate and causes you to sweat) each week. In addition, most adults need muscle-strengthening exercises on 2 or more days a week.   Maintain a healthy weight. The body mass index (BMI) is a screening tool to identify possible weight problems. It provides an estimate of body fat based on height and weight. Your caregiver can help determine your BMI, and can help you achieve or maintain a healthy weight.For adults 20 years and older:   A BMI below 18.5 is considered underweight.   A BMI of 18.5 to 24.9 is normal.   A BMI of 25 to 29.9 is considered overweight.   A BMI of 30 and above is  considered obese.   Maintain normal blood lipids and cholesterol levels by exercising and minimizing your intake of saturated fat. Eat a balanced diet with plenty of fruit and vegetables. Blood tests for lipids and cholesterol should begin at age 20 and be repeated every 5 years. If your lipid or cholesterol levels are high, you are over 50, or you are at high risk for heart disease, you may need your cholesterol levels checked more frequently.Ongoing high lipid and cholesterol levels should be treated with medicines if diet and exercise are not effective.   If you smoke, find out from your caregiver how to quit. If you do not use tobacco, do not start.   If you are pregnant, do not drink alcohol. If you are breastfeeding, be very cautious about drinking alcohol. If you are not pregnant and choose to drink alcohol, do not exceed 1 drink per day. One drink is considered to be 12 ounces (355 mL) of beer, 5 ounces (148 mL) of wine, or 1.5 ounces (44 mL) of liquor.   Avoid use of street drugs. Do not share needles with anyone. Ask for help if you need support or instructions about stopping the use of drugs.   High blood pressure causes heart disease and increases the risk of stroke. Your blood pressure should be checked at least every 1 to 2 years. Ongoing high blood pressure should be treated with medicines if weight loss and exercise are not effective.   If you are 55 to 57   years old, ask your caregiver if you should take aspirin to prevent strokes.   Diabetes screening involves taking a blood sample to check your fasting blood sugar level. This should be done once every 3 years, after age 45, if you are within normal weight and without risk factors for diabetes. Testing should be considered at a younger age or be carried out more frequently if you are overweight and have at least 1 risk factor for diabetes.   Breast cancer screening is essential preventive care for women. You should practice "breast  self-awareness." This means understanding the normal appearance and feel of your breasts and may include breast self-examination. Any changes detected, no matter how small, should be reported to a caregiver. Women in their 20s and 30s should have a clinical breast exam (CBE) by a caregiver as part of a regular health exam every 1 to 3 years. After age 40, women should have a CBE every year. Starting at age 40, women should consider having a mammography (breast X-ray test) every year. Women who have a family history of breast cancer should talk to their caregiver about genetic screening. Women at a high risk of breast cancer should talk to their caregivers about having magnetic resonance imaging (MRI) and a mammography every year.   The Pap test is a screening test for cervical cancer. A Pap test can show cell changes on the cervix that might become cervical cancer if left untreated. A Pap test is a procedure in which cells are obtained and examined from the lower end of the uterus (cervix).   Women should have a Pap test starting at age 21.   Between ages 21 and 29, Pap tests should be repeated every 2 years.   Beginning at age 30, you should have a Pap test every 3 years as long as the past 3 Pap tests have been normal.   Some women have medical problems that increase the chance of getting cervical cancer. Talk to your caregiver about these problems. It is especially important to talk to your caregiver if a new problem develops soon after your last Pap test. In these cases, your caregiver may recommend more frequent screening and Pap tests.   The above recommendations are the same for women who have or have not gotten the vaccine for human papillomavirus (HPV).   If you had a hysterectomy for a problem that was not cancer or a condition that could lead to cancer, then you no longer need Pap tests. Even if you no longer need a Pap test, a regular exam is a good idea to make sure no other problems are  starting.   If you are between ages 65 and 70, and you have had normal Pap tests going back 10 years, you no longer need Pap tests. Even if you no longer need a Pap test, a regular exam is a good idea to make sure no other problems are starting.   If you have had past treatment for cervical cancer or a condition that could lead to cancer, you need Pap tests and screening for cancer for at least 20 years after your treatment.   If Pap tests have been discontinued, risk factors (such as a new sexual partner) need to be reassessed to determine if screening should be resumed.   The HPV test is an additional test that may be used for cervical cancer screening. The HPV test looks for the virus that can cause the cell changes on the cervix.   The cells collected during the Pap test can be tested for HPV. The HPV test could be used to screen women aged 30 years and older, and should be used in women of any age who have unclear Pap test results. After the age of 30, women should have HPV testing at the same frequency as a Pap test.   Colorectal cancer can be detected and often prevented. Most routine colorectal cancer screening begins at the age of 50 and continues through age 75. However, your caregiver may recommend screening at an earlier age if you have risk factors for colon cancer. On a yearly basis, your caregiver may provide home test kits to check for hidden blood in the stool. Use of a small camera at the end of a tube, to directly examine the colon (sigmoidoscopy or colonoscopy), can detect the earliest forms of colorectal cancer. Talk to your caregiver about this at age 50, when routine screening begins. Direct examination of the colon should be repeated every 5 to 10 years through age 75, unless early forms of pre-cancerous polyps or small growths are found.   Hepatitis C blood testing is recommended for all people born from 1945 through 1965 and any individual with known risks for hepatitis C.    Practice safe sex. Use condoms and avoid high-risk sexual practices to reduce the spread of sexually transmitted infections (STIs). STIs include gonorrhea, chlamydia, syphilis, trichomonas, herpes, HPV, and human immunodeficiency virus (HIV). Herpes, HIV, and HPV are viral illnesses that have no cure. They can result in disability, cancer, and death. Sexually active women aged 25 and younger should be checked for chlamydia. Older women with new or multiple partners should also be tested for chlamydia. Testing for other STIs is recommended if you are sexually active and at increased risk.   Osteoporosis is a disease in which the bones lose minerals and strength with aging. This can result in serious bone fractures. The risk of osteoporosis can be identified using a bone density scan. Women ages 65 and over and women at risk for fractures or osteoporosis should discuss screening with their caregivers. Ask your caregiver whether you should take a calcium supplement or vitamin D to reduce the rate of osteoporosis.   Menopause can be associated with physical symptoms and risks. Hormone replacement therapy is available to decrease symptoms and risks. You should talk to your caregiver about whether hormone replacement therapy is right for you.   Use sunscreen with sun protection factor (SPF) of 30 or more. Apply sunscreen liberally and repeatedly throughout the day. You should seek shade when your shadow is shorter than you. Protect yourself by wearing long sleeves, pants, a wide-brimmed hat, and sunglasses year round, whenever you are outdoors.   Once a month, do a whole body skin exam, using a mirror to look at the skin on your back. Notify your caregiver of new moles, moles that have irregular borders, moles that are larger than a pencil eraser, or moles that have changed in shape or color.   Stay current with required immunizations.   Influenza. You need a dose every fall (or winter). The composition of  the flu vaccine changes each year, so being vaccinated once is not enough.   Pneumococcal polysaccharide. You need 1 to 2 doses if you smoke cigarettes or if you have certain chronic medical conditions. You need 1 dose at age 65 (or older) if you have never been vaccinated.   Tetanus, diphtheria, pertussis (Tdap, Td). Get 1 dose of   Tdap vaccine if you are younger than age 65, are over 65 and have contact with an infant, are a healthcare worker, are pregnant, or simply want to be protected from whooping cough. After that, you need a Td booster dose every 10 years. Consult your caregiver if you have not had at least 3 tetanus and diphtheria-containing shots sometime in your life or have a deep or dirty wound.   HPV. You need this vaccine if you are a woman age 26 or younger. The vaccine is given in 3 doses over 6 months.   Measles, mumps, rubella (MMR). You need at least 1 dose of MMR if you were born in 1957 or later. You may also need a second dose.   Meningococcal. If you are age 19 to 21 and a first-year college student living in a residence hall, or have one of several medical conditions, you need to get vaccinated against meningococcal disease. You may also need additional booster doses.   Zoster (shingles). If you are age 60 or older, you should get this vaccine.   Varicella (chickenpox). If you have never had chickenpox or you were vaccinated but received only 1 dose, talk to your caregiver to find out if you need this vaccine.   Hepatitis A. You need this vaccine if you have a specific risk factor for hepatitis A virus infection or you simply wish to be protected from this disease. The vaccine is usually given as 2 doses, 6 to 18 months apart.   Hepatitis B. You need this vaccine if you have a specific risk factor for hepatitis B virus infection or you simply wish to be protected from this disease. The vaccine is given in 3 doses, usually over 6 months.  Preventive Services /  Frequency Ages 19 to 39  Blood pressure check.** / Every 1 to 2 years.   Lipid and cholesterol check.** / Every 5 years beginning at age 20.   Clinical breast exam.** / Every 3 years for women in their 20s and 30s.   Pap test.** / Every 2 years from ages 21 through 29. Every 3 years starting at age 30 through age 65 or 70 with a history of 3 consecutive normal Pap tests.   HPV screening.** / Every 3 years from ages 30 through ages 65 to 70 with a history of 3 consecutive normal Pap tests.   Hepatitis C blood test.** / For any individual with known risks for hepatitis C.   Skin self-exam. / Monthly.   Influenza immunization.** / Every year.   Pneumococcal polysaccharide immunization.** / 1 to 2 doses if you smoke cigarettes or if you have certain chronic medical conditions.   Tetanus, diphtheria, pertussis (Tdap, Td) immunization. / A one-time dose of Tdap vaccine. After that, you need a Td booster dose every 10 years.   HPV immunization. / 3 doses over 6 months, if you are 26 and younger.   Measles, mumps, rubella (MMR) immunization. / You need at least 1 dose of MMR if you were born in 1957 or later. You may also need a second dose.   Meningococcal immunization. / 1 dose if you are age 19 to 21 and a first-year college student living in a residence hall, or have one of several medical conditions, you need to get vaccinated against meningococcal disease. You may also need additional booster doses.   Varicella immunization.** / Consult your caregiver.   Hepatitis A immunization.** / Consult your caregiver. 2 doses, 6 to 18 months   apart.   Hepatitis B immunization.** / Consult your caregiver. 3 doses usually over 6 months.  Ages 40 to 64  Blood pressure check.** / Every 1 to 2 years.   Lipid and cholesterol check.** / Every 5 years beginning at age 20.   Clinical breast exam.** / Every year after age 40.   Mammogram.** / Every year beginning at age 40 and continuing for as  long as you are in good health. Consult with your caregiver.   Pap test.** / Every 3 years starting at age 30 through age 65 or 70 with a history of 3 consecutive normal Pap tests.   HPV screening.** / Every 3 years from ages 30 through ages 65 to 70 with a history of 3 consecutive normal Pap tests.   Fecal occult blood test (FOBT) of stool. / Every year beginning at age 50 and continuing until age 75. You may not need to do this test if you get a colonoscopy every 10 years.   Flexible sigmoidoscopy or colonoscopy.** / Every 5 years for a flexible sigmoidoscopy or every 10 years for a colonoscopy beginning at age 50 and continuing until age 75.   Hepatitis C blood test.** / For all people born from 1945 through 1965 and any individual with known risks for hepatitis C.   Skin self-exam. / Monthly.   Influenza immunization.** / Every year.   Pneumococcal polysaccharide immunization.** / 1 to 2 doses if you smoke cigarettes or if you have certain chronic medical conditions.   Tetanus, diphtheria, pertussis (Tdap, Td) immunization.** / A one-time dose of Tdap vaccine. After that, you need a Td booster dose every 10 years.   Measles, mumps, rubella (MMR) immunization. / You need at least 1 dose of MMR if you were born in 1957 or later. You may also need a second dose.   Varicella immunization.** / Consult your caregiver.   Meningococcal immunization.** / Consult your caregiver.   Hepatitis A immunization.** / Consult your caregiver. 2 doses, 6 to 18 months apart.   Hepatitis B immunization.** / Consult your caregiver. 3 doses, usually over 6 months.  Ages 65 and over  Blood pressure check.** / Every 1 to 2 years.   Lipid and cholesterol check.** / Every 5 years beginning at age 20.   Clinical breast exam.** / Every year after age 40.   Mammogram.** / Every year beginning at age 40 and continuing for as long as you are in good health. Consult with your caregiver.   Pap test.** /  Every 3 years starting at age 30 through age 65 or 70 with a 3 consecutive normal Pap tests. Testing can be stopped between 65 and 70 with 3 consecutive normal Pap tests and no abnormal Pap or HPV tests in the past 10 years.   HPV screening.** / Every 3 years from ages 30 through ages 65 or 70 with a history of 3 consecutive normal Pap tests. Testing can be stopped between 65 and 70 with 3 consecutive normal Pap tests and no abnormal Pap or HPV tests in the past 10 years.   Fecal occult blood test (FOBT) of stool. / Every year beginning at age 50 and continuing until age 75. You may not need to do this test if you get a colonoscopy every 10 years.   Flexible sigmoidoscopy or colonoscopy.** / Every 5 years for a flexible sigmoidoscopy or every 10 years for a colonoscopy beginning at age 50 and continuing until age 75.   Hepatitis   C blood test.** / For all people born from 1945 through 1965 and any individual with known risks for hepatitis C.   Osteoporosis screening.** / A one-time screening for women ages 65 and over and women at risk for fractures or osteoporosis.   Skin self-exam. / Monthly.   Influenza immunization.** / Every year.   Pneumococcal polysaccharide immunization.** / 1 dose at age 65 (or older) if you have never been vaccinated.   Tetanus, diphtheria, pertussis (Tdap, Td) immunization. / A one-time dose of Tdap vaccine if you are over 65 and have contact with an infant, are a healthcare worker, or simply want to be protected from whooping cough. After that, you need a Td booster dose every 10 years.   Varicella immunization.** / Consult your caregiver.   Meningococcal immunization.** / Consult your caregiver.   Hepatitis A immunization.** / Consult your caregiver. 2 doses, 6 to 18 months apart.   Hepatitis B immunization.** / Check with your caregiver. 3 doses, usually over 6 months.  ** Family history and personal history of risk and conditions may change your caregiver's  recommendations. Document Released: 07/16/2001 Document Revised: 05/09/2011 Document Reviewed: 10/15/2010 [employer] Patient Information 2012 [employer]. 

## 2011-08-26 NOTE — Progress Notes (Signed)
Subjective:     Lauren Sanchez is a 56 y.o. female and is here for a comprehensive physical exam. The patient reports no problems.  History   Social History  . Marital Status: Married    Spouse Name: N/A    Number of Children: N/A  . Years of Education: N/A   Occupational History  . Not on file.   Social History Main Topics  . Smoking status: Current Some Day Smoker    Types: Cigarettes  . Smokeless tobacco: Never Used  . Alcohol Use: Yes     Very Little  . Drug Use: No  . Sexually Active: Yes   Other Topics Concern  . Not on file   Social History Narrative  . No narrative on file   Health Maintenance  Topic Date Due  . Tetanus/tdap  02/28/1974  . Influenza Vaccine  03/27/2012  . Mammogram  01/17/2013  . Pap Smear  08/26/2014  . Colonoscopy  01/30/2020    The following portions of the patient's history were reviewed and updated as appropriate: allergies, current medications, past family history, past medical history, past social history, past surgical history and problem list.  Review of Systems Review of Systems  Constitutional: Negative for activity change, appetite change and fatigue.  HENT: Negative for hearing loss, congestion, tinnitus and ear discharge.  dentist q8m Eyes: Negative for visual disturbance (see optho q1y -- vision corrected to 20/20 with glasses).  Respiratory: Negative for cough, chest tightness and shortness of breath.   Cardiovascular: Negative for chest pain, palpitations and leg swelling.  Gastrointestinal: Negative for abdominal pain, diarrhea, constipation and abdominal distention.  Genitourinary: Negative for urgency, frequency, decreased urine volume and difficulty urinating.  Musculoskeletal: Negative for back pain, arthralgias and gait problem.  Skin: Negative for color change, pallor and rash.  Neurological: Negative for dizziness, light-headedness, numbness and headaches.  Hematological: Negative for adenopathy. Does not bruise/bleed  easily.  Psychiatric/Behavioral: Negative for suicidal ideas, confusion, sleep disturbance, self-injury, dysphoric mood, decreased concentration and agitation.       Objective:    BP 108/72  Pulse 88  Temp(Src) 98.1 F (36.7 C) (Oral)  Ht 5\' 1"  (1.549 m)  Wt 111 lb (50.349 kg)  BMI 20.97 kg/m2  SpO2 98% General appearance: alert, cooperative, appears stated age and no distress Head: Normocephalic, without obvious abnormality, atraumatic Eyes: conjunctivae/corneas clear. PERRL, EOM's intact. Fundi benign. Ears: normal TM's and external ear canals both ears Nose: Nares normal. Septum midline. Mucosa normal. No drainage or sinus tenderness. Throat: lips, mucosa, and tongue normal; teeth and gums normal Neck: no adenopathy, no carotid bruit, no JVD, supple, symmetrical, trachea midline and thyroid not enlarged, symmetric, no tenderness/mass/nodules Back: symmetric, no curvature. ROM normal. No CVA tenderness. Lungs: clear to auscultation bilaterally Breasts: normal appearance, no masses or tenderness Heart: regular rate and rhythm, S1, S2 normal, no murmur, click, rub or gallop Abdomen: soft, non-tender; bowel sounds normal; no masses,  no organomegaly Pelvic: cervix normal in appearance, external genitalia normal, no adnexal masses or tenderness, no cervical motion tenderness, rectovaginal septum normal, uterus normal size, shape, and consistency and vagina normal without discharge Extremities: extremities normal, atraumatic, no cyanosis or edema Pulses: 2+ and symmetric Skin: Skin color, texture, turgor normal. No rashes or lesions Lymph nodes: Cervical, supraclavicular, and axillary nodes normal. Neurologic: Alert and oriented X 3, normal strength and tone. Normal symmetric reflexes. Normal coordination and gait psych-- no depression, no anxiety    Assessment:    Healthy female exam.  Plan:    ghm  UTd Labs reviewed  See After Visit Summary for Counseling  Recommendations

## 2011-09-04 ENCOUNTER — Other Ambulatory Visit: Payer: Self-pay | Admitting: Interventional Radiology

## 2011-09-04 ENCOUNTER — Ambulatory Visit (HOSPITAL_BASED_OUTPATIENT_CLINIC_OR_DEPARTMENT_OTHER)
Admission: RE | Admit: 2011-09-04 | Discharge: 2011-09-04 | Disposition: A | Discharge status: Home / Self Care | Payer: BC Managed Care – PPO | Source: Ambulatory Visit | Attending: Family Medicine | Admitting: Family Medicine

## 2011-09-04 DIAGNOSIS — E049 Nontoxic goiter, unspecified: Secondary | ICD-10-CM | POA: Insufficient documentation

## 2011-09-04 DIAGNOSIS — R220 Localized swelling, mass and lump, head: Secondary | ICD-10-CM | POA: Insufficient documentation

## 2011-09-04 DIAGNOSIS — E041 Nontoxic single thyroid nodule: Secondary | ICD-10-CM

## 2011-09-04 DIAGNOSIS — R221 Localized swelling, mass and lump, neck: Secondary | ICD-10-CM | POA: Insufficient documentation

## 2011-09-06 ENCOUNTER — Telehealth: Payer: Self-pay

## 2011-09-06 DIAGNOSIS — E041 Nontoxic single thyroid nodule: Secondary | ICD-10-CM

## 2011-09-06 NOTE — Telephone Encounter (Signed)
Discussed with patient and se voiced understanding. Referral put in.     KP

## 2011-09-06 NOTE — Telephone Encounter (Signed)
Message copied by Arnette Norris on Fri Sep 06, 2011  4:58 PM ------      Message from: Lelon Perla      Created: Wed Sep 04, 2011 11:44 AM       + nodule on thyroid--- needs ENT referral---bx recommended

## 2011-09-09 ENCOUNTER — Telehealth: Payer: Self-pay | Admitting: Family Medicine

## 2011-09-09 NOTE — Telephone Encounter (Signed)
Please advise    Kp 

## 2011-09-09 NOTE — Telephone Encounter (Signed)
Either can do biopsy but ENT is a Careers adviser--- if they need to remove it the ENT does the surgery not endo.

## 2011-09-10 NOTE — Telephone Encounter (Signed)
Discussed with patient and she voiced understanding, she had questions about interventional radiology but Dr. Laury Axon made patient aware she felt ENT would be best  Just in case surgery needed to be done.  The patient voiced understanding and agreed to keep apt    KP

## 2011-09-20 ENCOUNTER — Telehealth: Payer: Self-pay | Admitting: *Deleted

## 2011-09-20 NOTE — Telephone Encounter (Signed)
Jessica from [facility] called to have pt [place] sent to fax number [phone], as well as any office notes/labs, noted faxed manually to number advised

## 2012-03-10 ENCOUNTER — Other Ambulatory Visit: Payer: Self-pay

## 2012-03-10 MED ORDER — ATORVASTATIN CALCIUM 20 MG PO TABS: 20.0000 mg | ORAL_TABLET | Freq: Every day | ORAL | Status: DC

## 2012-03-10 NOTE — Telephone Encounter (Signed)
Pt states needs atleast 10 day supply going out of town will do blood work next week. Last OV 08/26/11 last refill 07/15/11 #90 x1. PLz advise         MW

## 2012-05-15 ENCOUNTER — Other Ambulatory Visit: Payer: BC Managed Care – PPO

## 2012-05-15 ENCOUNTER — Ambulatory Visit: Payer: BC Managed Care – PPO

## 2012-05-15 ENCOUNTER — Ambulatory Visit
Admission: RE | Admit: 2012-05-15 | Discharge: 2012-05-15 | Disposition: A | Discharge status: Home / Self Care | Payer: BC Managed Care – PPO | Source: Ambulatory Visit | Attending: Family Medicine | Admitting: Family Medicine

## 2012-05-15 DIAGNOSIS — Z1239 Encounter for other screening for malignant neoplasm of breast: Secondary | ICD-10-CM

## 2012-12-01 ENCOUNTER — Ambulatory Visit (INDEPENDENT_AMBULATORY_CARE_PROVIDER_SITE_OTHER): Payer: BC Managed Care – PPO | Admitting: Family Medicine

## 2012-12-01 ENCOUNTER — Encounter: Payer: Self-pay | Admitting: Family Medicine

## 2012-12-01 VITALS — BP 90/60 | HR 62 | Temp 98.6°F | Wt 105.6 lb

## 2012-12-01 DIAGNOSIS — R58 Hemorrhage, not elsewhere classified: Secondary | ICD-10-CM

## 2012-12-01 DIAGNOSIS — E785 Hyperlipidemia, unspecified: Secondary | ICD-10-CM

## 2012-12-01 DIAGNOSIS — I998 Other disorder of circulatory system: Secondary | ICD-10-CM

## 2012-12-01 DIAGNOSIS — T148XXA Other injury of unspecified body region, initial encounter: Secondary | ICD-10-CM | POA: Insufficient documentation

## 2012-12-01 NOTE — Assessment & Plan Note (Signed)
Warm compresses Already healing Check labs

## 2012-12-01 NOTE — Assessment & Plan Note (Signed)
Pt wanted to try diet first before lipitor Check labs

## 2012-12-01 NOTE — Progress Notes (Signed)
  Subjective:    Patient ID: Lauren Sanchez, female    DOB: 1956-04-03, 57 y.o.   MRN: [id number]  HPI Pt here c/o bruising -- already healing on inner L thigh.  No other complaints   Review of Systems As above     Objective:   Physical Exam BP 90/60  Pulse 62  Temp(Src) 98.6 F (37 C) (Oral)  Wt 105 lb 9.6 oz (47.9 kg)  BMI 19.96 kg/m2  SpO2 99% General appearance: alert, cooperative, appears stated age and no distress Skin: ecchymoses - upper leg(s) left==  Inner thigh--healing well        Assessment & Plan:

## 2012-12-02 ENCOUNTER — Other Ambulatory Visit (INDEPENDENT_AMBULATORY_CARE_PROVIDER_SITE_OTHER): Payer: BC Managed Care – PPO

## 2012-12-02 DIAGNOSIS — E785 Hyperlipidemia, unspecified: Secondary | ICD-10-CM

## 2012-12-02 LAB — CBC WITH DIFFERENTIAL/PLATELET
Eosinophils Relative: 1.9 % (ref 0.0–5.0)
HCT: 41 % (ref 36.0–46.0)
Hemoglobin: 13.7 g/dL (ref 12.0–15.0)
Lymphs Abs: 2.1 10*3/uL (ref 0.7–4.0)
Monocytes Relative: 8.9 % (ref 3.0–12.0)
Neutro Abs: 3.8 10*3/uL (ref 1.4–7.7)
RBC: 4.48 Mil/uL (ref 3.87–5.11)
WBC: 6.7 10*3/uL (ref 4.5–10.5)

## 2012-12-03 LAB — NMR LIPOPROFILE WITH LIPIDS
Cholesterol, Total: 261 mg/dL — ABNORMAL HIGH (ref <200)
LDL Particle Number: 1816 nmol/L — ABNORMAL HIGH (ref <1000)
Large VLDL-P: <0.8 nmol/L (ref <=2.7)
Triglycerides: 106 mg/dL (ref <150)
VLDL Size: 35.2 nm (ref <=46.6)

## 2012-12-10 ENCOUNTER — Other Ambulatory Visit: Payer: Self-pay

## 2012-12-10 MED ORDER — ATORVASTATIN CALCIUM 20 MG PO TABS: 20.0000 mg | ORAL_TABLET | Freq: Every day | ORAL | Status: DC

## 2012-12-25 ENCOUNTER — Encounter: Payer: Self-pay | Admitting: Internal Medicine

## 2012-12-25 ENCOUNTER — Ambulatory Visit (INDEPENDENT_AMBULATORY_CARE_PROVIDER_SITE_OTHER): Payer: BC Managed Care – PPO | Admitting: Internal Medicine

## 2012-12-25 VITALS — BP 116/70 | HR 78 | Temp 98.1°F | Wt 105.2 lb

## 2012-12-25 DIAGNOSIS — R82998 Other abnormal findings in urine: Secondary | ICD-10-CM

## 2012-12-25 DIAGNOSIS — R3989 Other symptoms and signs involving the genitourinary system: Secondary | ICD-10-CM

## 2012-12-25 DIAGNOSIS — N200 Calculus of kidney: Secondary | ICD-10-CM | POA: Insufficient documentation

## 2012-12-25 DIAGNOSIS — R319 Hematuria, unspecified: Secondary | ICD-10-CM

## 2012-12-25 LAB — POCT URINALYSIS DIPSTICK
Glucose, UA: NEGATIVE
Nitrite, UA: NEGATIVE
Spec Grav, UA: <=1.005
Urobilinogen, UA: 0.2

## 2012-12-25 NOTE — Progress Notes (Signed)
  Subjective:    Patient ID: Lauren Sanchez, female    DOB: 1955/11/07, 57 y.o.   MRN: [id number]  HPI   She noted extreme constipation 12/24/12; this resolved with straining.  Thereafter she noted urine which was dark and foamy. She has felt somewhat lightheaded.  She has a history of IBS but has had no issues with a gluten-free diet over the last 10 months.  Her CBC and differential 12/02/12 was completely normal with no anemia or low platelet count.  She has passed kidney stone spontaneously on 2 occasions     Review of Systems    She denies fever, chills, or sweats. She's had no Clay-colored stool.  She denies any dysuria or pyuria. She denies epistaxis, hemoptysis,  melena, or rectal bleeding.She has no unexplained weight loss, dysphagia, or abdominal pain. She has had some  Bruising LLE. She has no difficulty stopping bleeding with injury.      Objective:   Physical Exam General appearance : thin but in good health and nourishment w/o distress.  Eyes: No conjunctival inflammation or scleral icterus is present.  Oral exam: Dental hygiene is good; lips and gums are healthy appearing.There is no oropharyngeal erythema or exudate noted.   Heart:  Normal rate and regular rhythm. S1 and S2 normal without gallop, murmur, click, rub or other extra sounds     Lungs:Chest clear to auscultation; no wheezes, rhonchi,rales ,or rubs present.No increased work of breathing.   Abdomen: bowel sounds normal, soft and non-tender without masses, organomegaly or hernias noted.  No guarding or rebound . No flank tenderness  Skin:Warm & dry.  Intact without suspicious lesions or rashes ; no jaundice or tenting  Lymphatic: No lymphadenopathy is noted about the head, neck, axilla.             Assessment & Plan:  #1 hematuria with a large amount of blood in the urine without associated leukocytes. No dysuria or pyuria. Urine will be strained for renal calculi  #2 constipation,  resolved  Plan: See orders and recommendations

## 2012-12-25 NOTE — Patient Instructions (Addendum)
Natural interventions to treat or prevent constipation would include drinking to thirst, up to 32 ounces of fluids daily; eating 7-9 servings of fresh fruits or vegetables a day; and increasing roughage in the diet such as whole grains. The OTC  fiber products (Example: Metamucil, etc) can be employed if these natural maneuvers do not correct the issue. Finally MiraLax every third day as needed is an option. Strain urine.

## 2012-12-28 LAB — URINE CULTURE

## 2013-01-11 ENCOUNTER — Telehealth: Payer: Self-pay | Admitting: Family Medicine

## 2013-01-11 NOTE — Telephone Encounter (Signed)
Patient states that she had another occurrence of blood in her urine over the weekend. Patient came in to see Dr. Alwyn Ren for this on 12/25/12. Lab results from 12/25/12 read: "A Urology referral is recommended for recurrent or persistent symptoms to rule out such processes."  Please advise on referral.

## 2013-01-12 NOTE — Telephone Encounter (Signed)
Spoke with patient and she states that she has only had the one occurrence. It cleared when she pushed fluids.  Please advise. Would you like to move forward with the referral?

## 2013-01-12 NOTE — Telephone Encounter (Signed)
I would @ least recheck dip urine for blood because of this episode and the history of spontaneous passage of kidney stones on 2 occasions in the past.

## 2013-01-12 NOTE — Telephone Encounter (Signed)
Per Dr Laury Axon schedule appt to recheck. Advised patient and scheduled appt for 01/14/2013

## 2013-01-14 ENCOUNTER — Telehealth: Payer: Self-pay | Admitting: Internal Medicine

## 2013-01-14 ENCOUNTER — Encounter: Payer: Self-pay | Admitting: Family Medicine

## 2013-01-14 ENCOUNTER — Ambulatory Visit (INDEPENDENT_AMBULATORY_CARE_PROVIDER_SITE_OTHER): Payer: BC Managed Care – PPO | Admitting: Family Medicine

## 2013-01-14 VITALS — BP 102/66 | HR 88 | Temp 98.2°F | Wt 106.2 lb

## 2013-01-14 DIAGNOSIS — R350 Frequency of micturition: Secondary | ICD-10-CM

## 2013-01-14 DIAGNOSIS — K649 Unspecified hemorrhoids: Secondary | ICD-10-CM

## 2013-01-14 DIAGNOSIS — K644 Residual hemorrhoidal skin tags: Secondary | ICD-10-CM

## 2013-01-14 DIAGNOSIS — R319 Hematuria, unspecified: Secondary | ICD-10-CM | POA: Insufficient documentation

## 2013-01-14 LAB — POCT URINALYSIS DIPSTICK
Ketones, UA: NEGATIVE
Protein, UA: NEGATIVE
Spec Grav, UA: <=1.005
pH, UA: 6.5

## 2013-01-14 MED ORDER — CIPROFLOXACIN HCL 500 MG PO TABS: 500.0000 mg | ORAL_TABLET | Freq: Two times a day (BID) | ORAL | Status: DC

## 2013-01-14 MED ORDER — HYDROCORTISONE ACETATE 25 MG RE SUPP: 25.0000 mg | Freq: Two times a day (BID) | RECTAL | Status: DC

## 2013-01-14 MED ORDER — HYDROCORTISONE 2.5 % RE CREA: TOPICAL_CREAM | Freq: Two times a day (BID) | RECTAL | Status: DC

## 2013-01-14 NOTE — Assessment & Plan Note (Signed)
Refer to GI See meds and orders

## 2013-01-14 NOTE — Patient Instructions (Signed)
Hemorrhoids Hemorrhoids are swollen veins around the rectum or anus. There are two types of hemorrhoids:   Internal hemorrhoids. These occur in the veins just inside the rectum. They may poke through to the outside and become irritated and painful.  External hemorrhoids. These occur in the veins outside the anus and can be felt as a painful swelling or hard lump near the anus. CAUSES  Pregnancy.   Obesity.   Constipation or diarrhea.   Straining to have a bowel movement.   Sitting for long periods on the toilet.  Heavy lifting or other activity that caused you to strain.  Anal intercourse. SYMPTOMS   Pain.   Anal itching or irritation.   Rectal bleeding.   Fecal leakage.   Anal swelling.   One or more lumps around the anus.  DIAGNOSIS  Your caregiver may be able to diagnose hemorrhoids by visual examination. Other examinations or tests that may be performed include:   Examination of the rectal area with a gloved hand (digital rectal exam).   Examination of anal canal using a small tube (scope).   A blood test if you have lost a significant amount of blood.  A test to look inside the colon (sigmoidoscopy or colonoscopy). TREATMENT Most hemorrhoids can be treated at home. However, if symptoms do not seem to be getting better or if you have a lot of rectal bleeding, your caregiver may perform a procedure to help make the hemorrhoids get smaller or remove them completely. Possible treatments include:   Placing a rubber band at the base of the hemorrhoid to cut off the circulation (rubber band ligation).   Injecting a chemical to shrink the hemorrhoid (sclerotherapy).   Using a tool to burn the hemorrhoid (infrared light therapy).   Surgically removing the hemorrhoid (hemorrhoidectomy).   Stapling the hemorrhoid to block blood flow to the tissue (hemorrhoid stapling).  HOME CARE INSTRUCTIONS   Eat foods with fiber, such as whole grains, beans,  nuts, fruits, and vegetables. Ask your doctor about taking products with added fiber in them (fibersupplements).  Increase fluid intake. Drink enough water and fluids to keep your urine clear or pale yellow.   Exercise regularly.   Go to the bathroom when you have the urge to have a bowel movement. Do not wait.   Avoid straining to have bowel movements.   Keep the anal area dry and clean. Use wet toilet paper or moist towelettes after a bowel movement.   Medicated creams and suppositories may be used or applied as directed.   Only take over-the-counter or prescription medicines as directed by your caregiver.   Take warm sitz baths for 15 20 minutes, 3 4 times a day to ease pain and discomfort.   Place ice packs on the hemorrhoids if they are tender and swollen. Using ice packs between sitz baths may be helpful.   Put ice in a plastic bag.   Place a towel between your skin and the bag.   Leave the ice on for 15 20 minutes, 3 4 times a day.   Do not use a donut-shaped pillow or sit on the toilet for long periods. This increases blood pooling and pain.  SEEK MEDICAL CARE IF:  You have increasing pain and swelling that is not controlled by treatment or medicine.  You have uncontrolled bleeding.  You have difficulty or you are unable to have a bowel movement.  You have pain or inflammation outside the area of the hemorrhoids. MAKE SURE YOU:    Understand these instructions.  Will watch your condition.  Will get help right away if you are not doing well or get worse. Document Released: 05/17/2000 Document Revised: 05/06/2012 Document Reviewed: 03/24/2012 [employer] Patient Information 2014 [employer].  

## 2013-01-14 NOTE — Progress Notes (Signed)
  Subjective:    Patient ID: Lauren Sanchez, female    DOB: Mar 02, 1956, 57 y.o.   MRN: [id number]  HPI Pt is here to f/u from hematuria.  See last visit.  Pt has had some more episodes of gross hematuria and hemorrhoids from straining to go to bathroom.  Pt does not feel like this is a kidney stone because she really has no pain.   She does have urinary frequency and occasional dysuria.   Review of Systems As above    Objective:   Physical Exam  BP 102/66  Pulse 88  Temp(Src) 98.2 F (36.8 C) (Oral)  Wt 106 lb 3.2 oz (48.172 kg)  BMI 20.08 kg/m2  SpO2 99% General appearance: alert, cooperative, appears stated age and no distress Abdomen: soft, non-tender; bowel sounds normal; no masses,  no organomegaly Rectal-- + ext hemorrhoids + bleeding, unable to do rectal secondary to size of hemorrhoids and pain       Assessment & Plan:

## 2013-01-14 NOTE — Assessment & Plan Note (Signed)
Check culture Refer to urology  rx cipro given to take if symptoms worsen

## 2013-01-14 NOTE — Telephone Encounter (Signed)
See Dr. Ernst Spell note, office visit with Doug Sou, PA on 01/18/13 8;30

## 2013-01-15 ENCOUNTER — Encounter: Payer: Self-pay | Admitting: *Deleted

## 2013-01-16 LAB — URINE CULTURE

## 2013-01-18 ENCOUNTER — Ambulatory Visit (INDEPENDENT_AMBULATORY_CARE_PROVIDER_SITE_OTHER): Payer: BC Managed Care – PPO | Admitting: Gastroenterology

## 2013-01-18 ENCOUNTER — Encounter: Payer: Self-pay | Admitting: Gastroenterology

## 2013-01-18 VITALS — BP 92/62 | HR 68 | Ht 61.0 in | Wt 104.4 lb

## 2013-01-18 DIAGNOSIS — K625 Hemorrhage of anus and rectum: Secondary | ICD-10-CM

## 2013-01-18 DIAGNOSIS — K649 Unspecified hemorrhoids: Secondary | ICD-10-CM

## 2013-01-18 NOTE — Progress Notes (Signed)
01/18/2013 Brendan Gruwell [id number] May 19, 1956   History of Present Illness:  Patient is a pleasant 57 year old female who presents to our office today at the request of her PCP to discuss treatment for her hemorrhoids.  She recently had an episode of severe constipation a few weeks ago and was straining very hard to move her bowels.  Since that time she has been seeing some blood with BM's and hemorrhoids are sore.  Has long-term issues with hemorrhoids and has just "lived with them".  Had banding one time several years ago.  Dr. Marvell Fuller last note stated that there were prolapsed hemorrhoids present.  Her last colonoscopy was 01/2010 at which time she was found to have one polyp (was a mucosal prolapse polyp on pathology), mild diverticulosis, and internal hemorrhoids.  Repeat was recommended in 10 years from that time.  She has already seen her PCP who gave her anusol suppositories and analpram cream to use, however, she has not been utilizing those medications.  Prior to this episode of constipation her BM's were very regular.  She has now increased her water consumption and has not experienced any further issues with straining.  Has long-standing IBS as well.  Current Medications, Allergies, Past Medical History, Past Surgical History, Family History and Social History were reviewed in [employer] record.   Physical Exam: BP 92/62  Pulse 68  Ht 5\' 1"  (1.549 m)  Wt 104 lb 6.4 oz (47.356 kg)  BMI 19.74 kg/m2 General: Well developed white female in no acute distress Head: Normocephalic and atraumatic Eyes:  sclerae anicteric, conjunctiva pink  Ears: Normal auditory acuity Musculoskeletal: Symmetrical with no gross deformities  Extremities: No edema  Neurological: Alert oriented x 4, grossly nonfocal Psychological:  Alert and cooperative. Normal mood and affect  Assessment and Recommendations: -Hemorrhoids with bleeding:  Has already been given prescriptions for anusol  suppositories and analpram cream, but she has not been using them.  She will use those as directed.  Will give her information on the [place] banding and if she wishes to discuss this further with Dr. Leone Payor, then she will call back to be scheduled for an appointment with him.

## 2013-01-18 NOTE — Patient Instructions (Addendum)
You have been given a separate informational sheet regarding your tobacco use, the importance of quitting and local resources to help you quit. We have given you information on Hemorrhoidal banding , a procedure that Dr. Leone Payor does in our office. If you are interested in this you may call and make an appointment to discuss this with Dr. Leone Payor. You can use the Anusol HC suppositories and cream.

## 2013-01-19 ENCOUNTER — Encounter: Payer: Self-pay | Admitting: Family Medicine

## 2013-01-21 NOTE — Progress Notes (Signed)
Agree w/ Ms. Zehr's management. 

## 2013-01-31 ENCOUNTER — Encounter (HOSPITAL_BASED_OUTPATIENT_CLINIC_OR_DEPARTMENT_OTHER): Payer: Self-pay | Admitting: Emergency Medicine

## 2013-01-31 ENCOUNTER — Emergency Department (HOSPITAL_BASED_OUTPATIENT_CLINIC_OR_DEPARTMENT_OTHER): Payer: BC Managed Care – PPO

## 2013-01-31 ENCOUNTER — Emergency Department (HOSPITAL_BASED_OUTPATIENT_CLINIC_OR_DEPARTMENT_OTHER)
Admission: EM | Admit: 2013-01-31 | Discharge: 2013-01-31 | Disposition: A | Discharge status: Home / Self Care | Payer: BC Managed Care – PPO | Attending: Emergency Medicine | Admitting: Emergency Medicine

## 2013-01-31 DIAGNOSIS — E785 Hyperlipidemia, unspecified: Secondary | ICD-10-CM | POA: Insufficient documentation

## 2013-01-31 DIAGNOSIS — Z860100 Personal history of colon polyps, unspecified: Secondary | ICD-10-CM | POA: Insufficient documentation

## 2013-01-31 DIAGNOSIS — R319 Hematuria, unspecified: Secondary | ICD-10-CM

## 2013-01-31 DIAGNOSIS — N200 Calculus of kidney: Secondary | ICD-10-CM | POA: Insufficient documentation

## 2013-01-31 DIAGNOSIS — K5900 Constipation, unspecified: Secondary | ICD-10-CM | POA: Insufficient documentation

## 2013-01-31 DIAGNOSIS — K589 Irritable bowel syndrome without diarrhea: Secondary | ICD-10-CM | POA: Insufficient documentation

## 2013-01-31 DIAGNOSIS — Z79899 Other long term (current) drug therapy: Secondary | ICD-10-CM | POA: Insufficient documentation

## 2013-01-31 DIAGNOSIS — F17200 Nicotine dependence, unspecified, uncomplicated: Secondary | ICD-10-CM | POA: Insufficient documentation

## 2013-01-31 LAB — URINE MICROSCOPIC-ADD ON

## 2013-01-31 LAB — URINALYSIS, ROUTINE W REFLEX MICROSCOPIC
Nitrite: NEGATIVE
Specific Gravity, Urine: 1.006 (ref 1.005–1.030)
pH: 6.5 (ref 5.0–8.0)

## 2013-01-31 LAB — COMPREHENSIVE METABOLIC PANEL
Albumin: 4 g/dL (ref 3.5–5.2)
BUN: 12 mg/dL (ref 6–23)
Chloride: 104 mEq/L (ref 96–112)
Creatinine, Ser: 0.6 mg/dL (ref 0.50–1.10)
GFR calc non Af Amer: >90 mL/min (ref >90)
Total Bilirubin: 0.3 mg/dL (ref 0.3–1.2)

## 2013-01-31 LAB — CBC WITH DIFFERENTIAL/PLATELET
Basophils Relative: 0 % (ref 0–1)
Eosinophils Relative: 1 % (ref 0–5)
HCT: 39.8 % (ref 36.0–46.0)
Hemoglobin: 13.2 g/dL (ref 12.0–15.0)
MCH: 30.1 pg (ref 26.0–34.0)
MCHC: 33.2 g/dL (ref 30.0–36.0)
MCV: 90.7 fL (ref 78.0–100.0)
Monocytes Absolute: 0.8 10*3/uL (ref 0.1–1.0)
Monocytes Relative: 8 % (ref 3–12)
Neutro Abs: 7.5 10*3/uL (ref 1.7–7.7)

## 2013-01-31 MED ORDER — ONDANSETRON HCL 4 MG PO TABS: 4.0000 mg | ORAL_TABLET | Freq: Four times a day (QID) | ORAL | Status: DC

## 2013-01-31 MED ORDER — TAMSULOSIN HCL 0.4 MG PO CAPS: 0.4000 mg | ORAL_CAPSULE | Freq: Every day | ORAL | Status: DC

## 2013-01-31 MED ORDER — OXYCODONE-ACETAMINOPHEN 5-325 MG PO TABS: 1.0000 | ORAL_TABLET | ORAL | Status: DC | PRN

## 2013-01-31 NOTE — ED Notes (Addendum)
Pt states she has seen doc for blood in her urine, has appointment with urologist on Thursday but now with right sided flank pain and more blood in urine, pt tearfuul, states this feels like the beginning of a kidney stone

## 2013-01-31 NOTE — ED Notes (Signed)
Patient transported to CT 

## 2013-01-31 NOTE — ED Notes (Signed)
MD at bedside. 

## 2013-01-31 NOTE — ED Provider Notes (Signed)
This chart was scribed for [name], DO by Leone Payor, ED Scribe. This patient was seen in room MH07/MH07 and the patient's care was started 6:41 PM.  TIME SEEN: 6:41 PM   CHIEF COMPLAINT: right flank pain  HPI: HPI Comments: Lauren Sanchez is a 57 y.o. female who presents to the Emergency Department complaining of 2 days of gradual onset, gradually worsening, constant right sided flank pain with associated hematuria. She also has associated urinary frequency, nausea, intermittent dysuria, and constipation. Patient reports 2 months of intermittent gross hematuria. She has had 2 urinalysis and urine cultures that showed no sign of infection. This is her 3rd episode this month and began to have the flank pain with this episode so she decided to be evaluated in the ED. She also reports having the episodes of hematuria after 1-2 hours of having difficult BMs. Pain is worsened with movement. She has a history of kidney stones and believes this may be similar. She denies fever, diarrhea, bloody stools or melena, vaginal bleeding, vaginal discharge, bleeding gums, easy bruising.  ROS: See HPI Constitutional: no fever  Eyes: no drainage  ENT: no runny nose   Cardiovascular:  no chest pain  Resp: no SOB  GI: nausea but no vomiting GU: no dysuria Integumentary: no rash  Allergy: no hives  Musculoskeletal: no leg swelling  Neurological: no slurred speech ROS otherwise negative  PAST MEDICAL HISTORY/PAST SURGICAL HISTORY:  Past Medical History  Diagnosis Date  . Hyperlipidemia   . IBS (irritable bowel syndrome)     Gluten Free  . Nephrolithiasis   . Kidney stone     x's 2  . Polyp, sigmoid colon 01/29/2010  . Thyroid disease     MEDICATIONS:  Prior to Admission medications   Medication Sig Start Date End Date Taking? Authorizing Provider  atorvastatin (LIPITOR) 20 MG tablet Take 1 tablet (20 mg total) by mouth daily. 12/10/12   Lelon Perla, DO  hydrocortisone (ANUSOL-HC) 2.5 %  rectal cream Place rectally 2 (two) times daily as needed. 01/14/13   Lelon Perla, DO  hydrocortisone (ANUSOL-HC) 25 MG suppository Place 25 mg rectally 2 (two) times daily as needed. 01/14/13   Lelon Perla, DO  valACYclovir (VALTREX) 1000 MG tablet Take 1,000 mg by mouth 2 (two) times daily as needed. 07/15/11   Lelon Perla, DO    ALLERGIES:  No Known Allergies  SOCIAL HISTORY:  History  Substance Use Topics  . Smoking status: Current Some Day Smoker    Types: Cigarettes  . Smokeless tobacco: Never Used     Comment: form given 01-18-13  . Alcohol Use: Yes     Comment: Very Little    FAMILY HISTORY: Family History  Problem Relation Age of Onset  . Skin cancer Father     BCC  . Glaucoma Father   . Cataracts Father   . Diverticulitis Father   . Heart disease Father     Pacemaker for bradycardia/Carotid Enarterectomy  . Hypertension Father   . Diabetes Father   . Dementia      Maternal side  . Lung cancer    . Osteopenia Mother   . Colon polyps Mother   . Macular degeneration Mother   . Heart murmur Mother   . Heart disease Mother     pacemaker  . Arthritis Mother   . Thyroid nodules Sister   . Colon cancer Maternal Grandmother     ??? lived to be 100!!!  . Kidney  disease Neg Hx   . Liver disease Neg Hx     EXAM: BP 122/76  Pulse 91  Temp(Src) 98 F (36.7 C)  SpO2 100% CONSTITUTIONAL: Alert and oriented and responds appropriately to questions. Well-appearing; well-nourished HEAD: Normocephalic EYES: Conjunctivae clear, PERRL ENT: normal nose; no rhinorrhea; moist mucous membranes; pharynx without lesions noted NECK: Supple, no meningismus, no LAD  CARD: RRR; S1 and S2 appreciated; no murmurs, no clicks, no rubs, no gallops RESP: Normal chest excursion without splinting or tachypnea; breath sounds clear and equal bilaterally; no wheezes, no rhonchi, no rales,  ABD/GI: Normal bowel sounds; non-distended; soft, non-tender, no rebound, no guarding BACK:   The back appears normal and is non-tender to palpation, mild CVA tenderness on the right side EXT: Normal ROM in all joints; non-tender to palpation; no edema; normal capillary refill; no cyanosis    SKIN: Normal color for age and race; warm NEURO: Moves all extremities equally PSYCH: The patient's mood and manner are appropriate. Grooming and personal hygiene are appropriate.  MEDICAL DECISION MAKING: Patient gross hematuria for several weeks. No sign of urinary tract infection on urinalysis. Culture pending. Will obtain labs and CT scan. Patient denies wanting any nausea medicine at this time. Patient does have a history of nephrolithiasis I suspect this may be the cause of her hematuria.  ED PROGRESS:  6:47 PM Discussed treatment plan with pt at bedside and pt agreed to plan.   9:00 PM  Labs unremarkable. Urine shows gross hematuria and many bacteria but no other sign of infection. Urine culture pending. Patient reports she's had her urine checked twice per PCP and has been negative for UTI with 2 negative cultures. I do not feel she needs antibiotics at this time. CT scan shows 3 mm distal right ureteral stone results in proximal mild to moderate right hydroureteronephrosis.  We'll discharge home with Flomax, pain in nausea medication. Given return precautions. Patient has urology followup on Thursday, 4 days from now. Patient verbalizes understanding is comfortable this plan.   I personally performed the services described in this documentation, which was scribed in my presence. The recorded information has been reviewed and is accurate.   [name], DO 01/31/13 [id number]

## 2013-01-31 NOTE — ED Notes (Signed)
D/c home with rx x 3- urine strainer given for home use

## 2013-02-01 LAB — URINE CULTURE

## 2013-02-06 IMAGING — US US SOFT TISSUE HEAD/NECK
1 series · 14 of 20 positions shown · non-contrast
Comparison: None.

CLINICAL DATA: Palpable nodule in the left neck.

THYROID ULTRASOUND
TECHNIQUE: Ultrasound examination of the thyroid gland and adjacent
soft tissues was performed.

[Series 1: us soft tissue head/neck · 0.07mm/px · 14 of 20 slices shown]
[im 1/20]
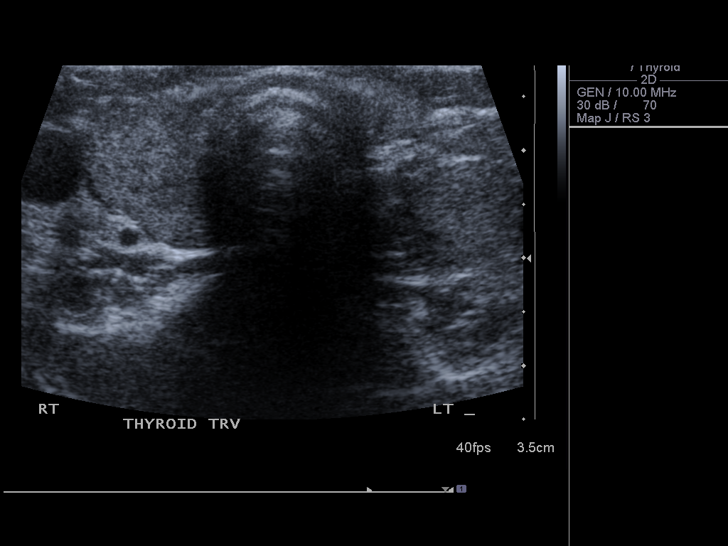
[im 3/20]
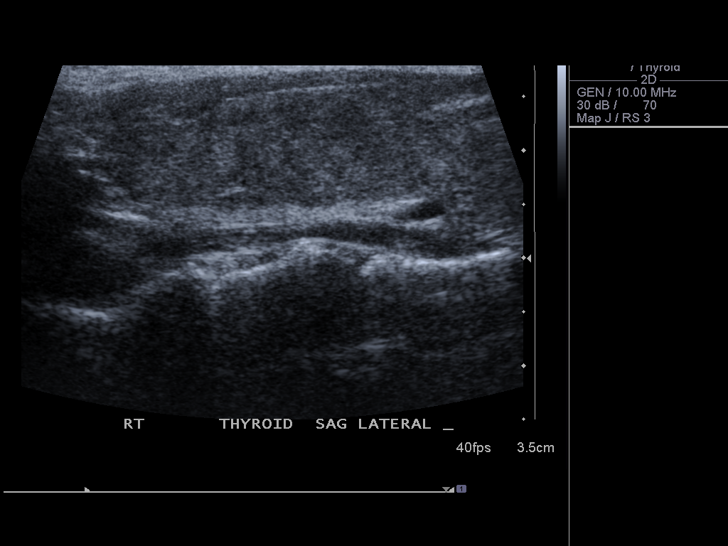
[im 4/20]
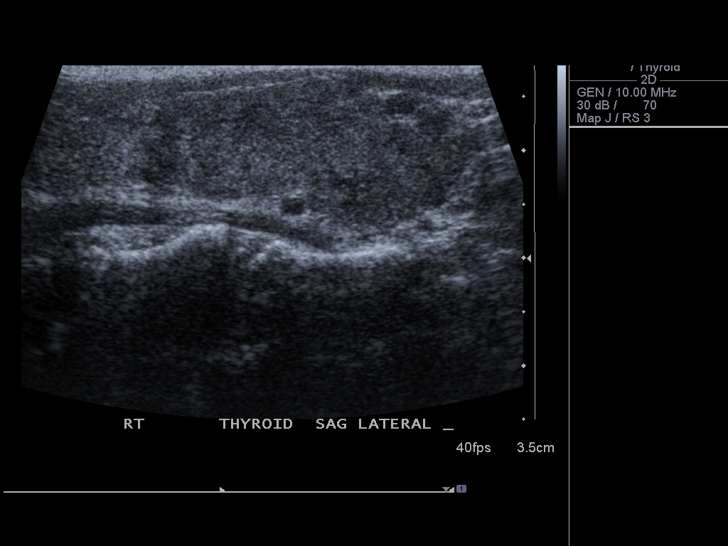
[im 6/20]
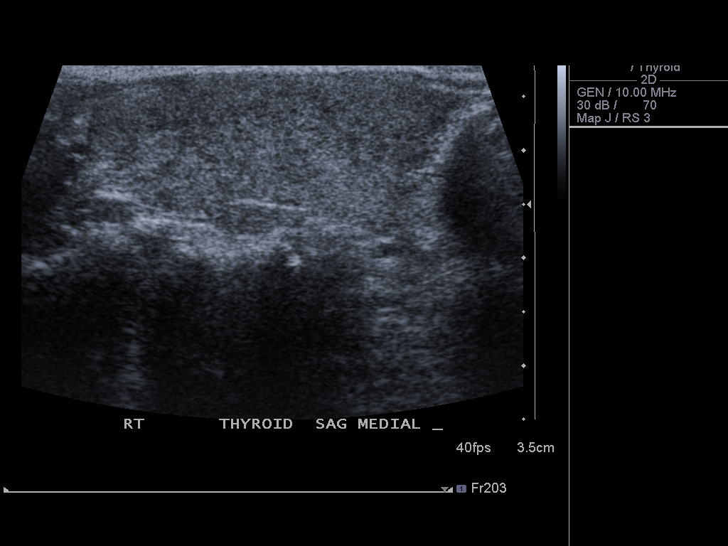
[im 7/20]
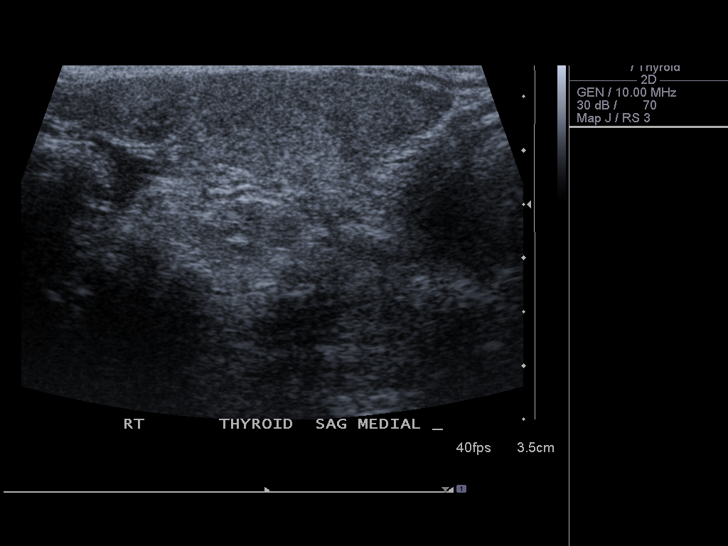
[im 8/20]
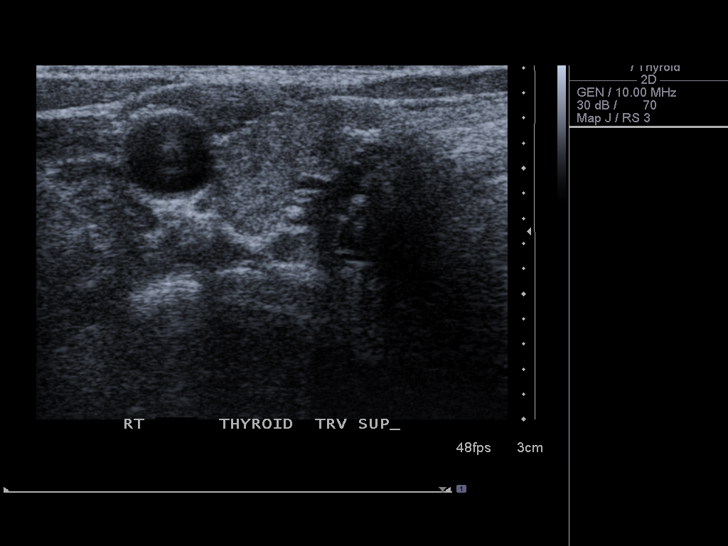
[im 10/20]
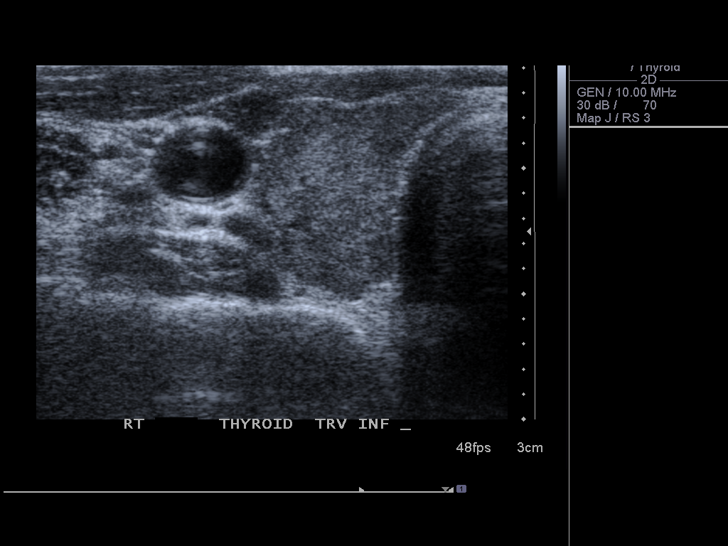
[im 11/20]
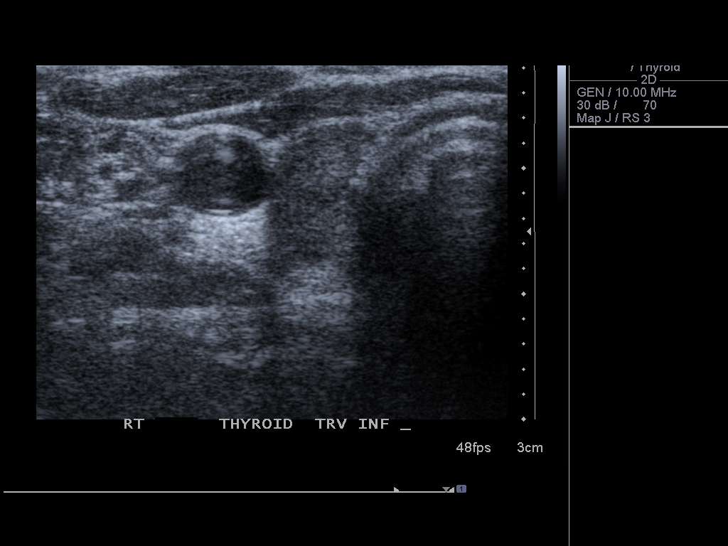
[im 13/20]
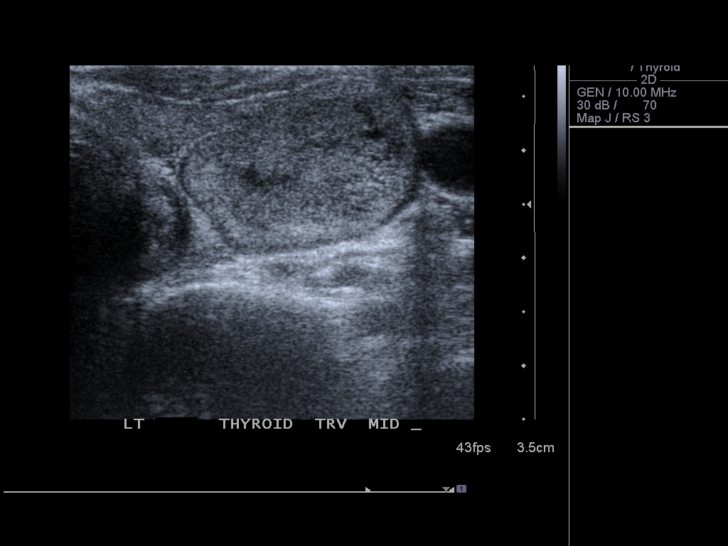
[im 14/20]
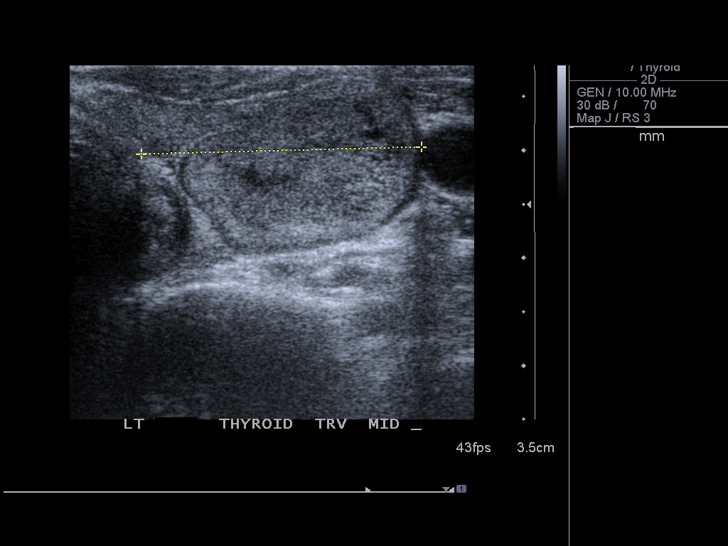
[im 16/20]
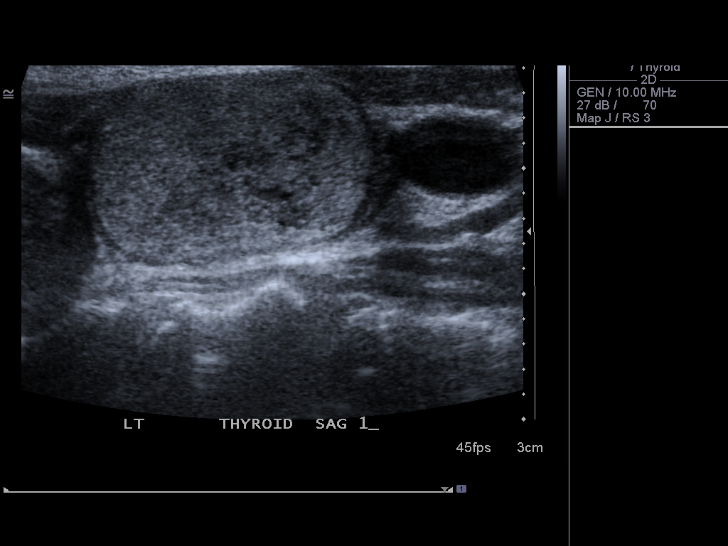
[im 17/20]
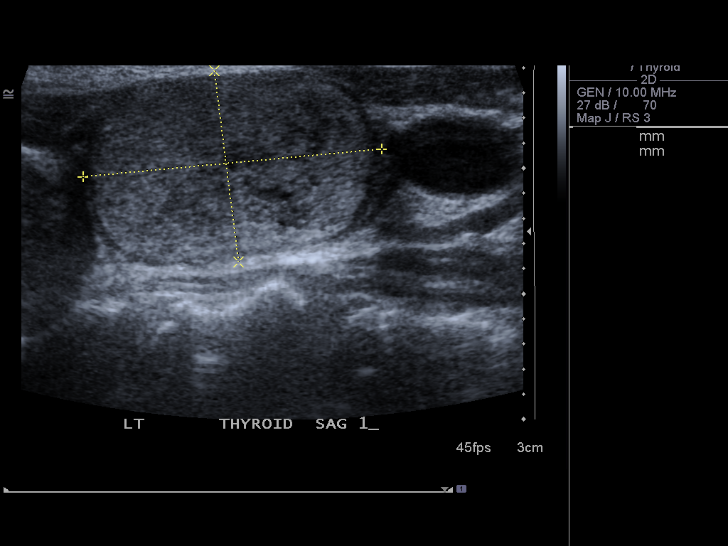
[im 18/20]
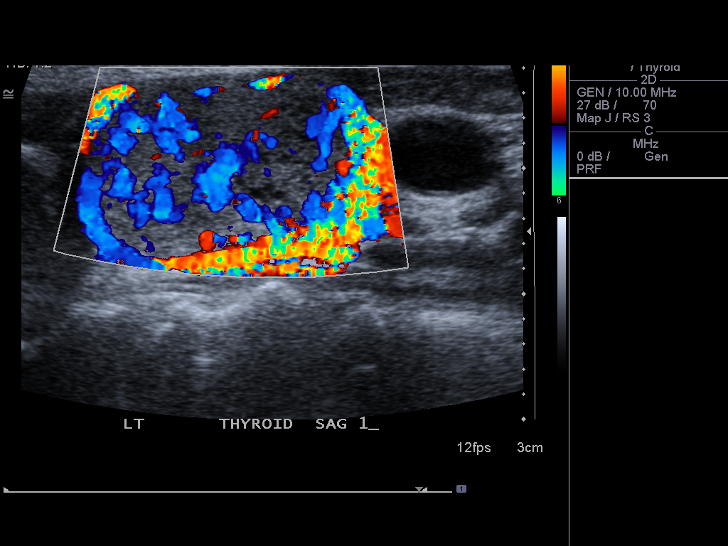
[im 20/20]
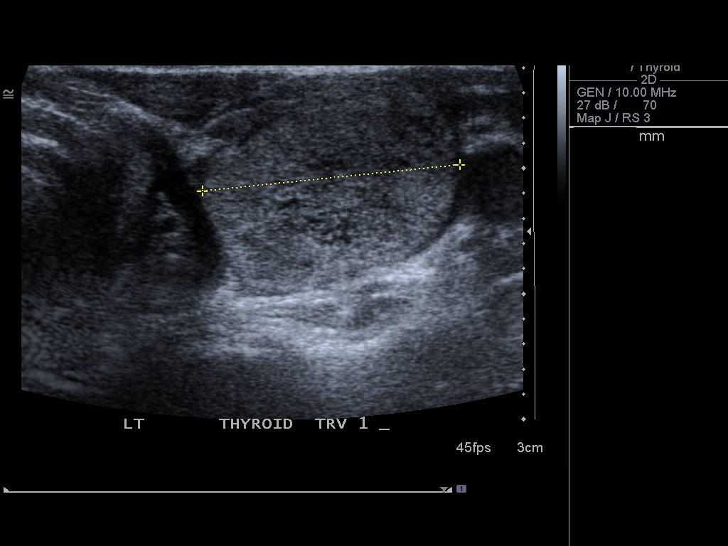

[14 of 20 positions shown; findings below may reference images not displayed]

FINDINGS: Right thyroid lobe:  4.3 x 1.7 x 1.6 cm
Left thyroid lobe:  3.4 x 1.4 x 2.6 cm
Isthmus:  0.3 cm

Focal nodules:  An ovoid solid nodule in the midportion of the left
lobe measures approximately 1.4 x 2.2 x 2.0 cm.  This nodule is
discretely marginated and does not contain internal calcifications.
No other thyroid nodules are identified.

Lymphadenopathy:  None visualized.
IMPRESSION: Dominant left thyroid nodule measuring just over 2 cm in diameter.

Findings meet consensus criteria for biopsy.  Ultrasound-guided
fine needle aspiration should be considered, as per the consensus
statement: Management of Thyroid Nodules Detected at US:  Society
of Radiologists in Ultrasound Consensus Conference Statement.

## 2013-03-13 ENCOUNTER — Emergency Department (HOSPITAL_BASED_OUTPATIENT_CLINIC_OR_DEPARTMENT_OTHER)
Admission: EM | Admit: 2013-03-13 | Discharge: 2013-03-13 | Disposition: A | Discharge status: Home / Self Care | Payer: BC Managed Care – PPO | Attending: Emergency Medicine | Admitting: Emergency Medicine

## 2013-03-13 ENCOUNTER — Emergency Department (HOSPITAL_BASED_OUTPATIENT_CLINIC_OR_DEPARTMENT_OTHER): Payer: BC Managed Care – PPO

## 2013-03-13 ENCOUNTER — Encounter (HOSPITAL_BASED_OUTPATIENT_CLINIC_OR_DEPARTMENT_OTHER): Payer: Self-pay | Admitting: Emergency Medicine

## 2013-03-13 DIAGNOSIS — Z862 Personal history of diseases of the blood and blood-forming organs and certain disorders involving the immune mechanism: Secondary | ICD-10-CM | POA: Insufficient documentation

## 2013-03-13 DIAGNOSIS — IMO0002 Reserved for concepts with insufficient information to code with codable children: Secondary | ICD-10-CM | POA: Insufficient documentation

## 2013-03-13 DIAGNOSIS — F17200 Nicotine dependence, unspecified, uncomplicated: Secondary | ICD-10-CM | POA: Insufficient documentation

## 2013-03-13 DIAGNOSIS — N200 Calculus of kidney: Secondary | ICD-10-CM | POA: Insufficient documentation

## 2013-03-13 DIAGNOSIS — Z860100 Personal history of colon polyps, unspecified: Secondary | ICD-10-CM | POA: Insufficient documentation

## 2013-03-13 DIAGNOSIS — Z8639 Personal history of other endocrine, nutritional and metabolic disease: Secondary | ICD-10-CM | POA: Insufficient documentation

## 2013-03-13 LAB — URINALYSIS, ROUTINE W REFLEX MICROSCOPIC
Glucose, UA: NEGATIVE mg/dL
Protein, ur: NEGATIVE mg/dL
Urobilinogen, UA: 0.2 mg/dL (ref 0.0–1.0)

## 2013-03-13 LAB — BASIC METABOLIC PANEL
Calcium: 9.1 mg/dL (ref 8.4–10.5)
Creatinine, Ser: 0.7 mg/dL (ref 0.50–1.10)
GFR calc Af Amer: >90 mL/min (ref >90)

## 2013-03-13 LAB — URINE MICROSCOPIC-ADD ON

## 2013-03-13 MED ORDER — SODIUM CHLORIDE 0.9 % IV SOLN
Freq: Once | INTRAVENOUS | Status: AC
  Administered 2013-03-13: 1000 mL/h via INTRAVENOUS

## 2013-03-13 MED ORDER — OXYCODONE-ACETAMINOPHEN 5-325 MG PO TABS: 2.0000 | ORAL_TABLET | ORAL | Status: DC | PRN

## 2013-03-13 MED ORDER — ONDANSETRON HCL 4 MG/2ML IJ SOLN
4.0000 mg | Freq: Once | INTRAMUSCULAR | Status: AC
  Administered 2013-03-13: 4 mg via INTRAVENOUS

## 2013-03-13 MED ORDER — HYDROMORPHONE HCL PF 1 MG/ML IJ SOLN
INTRAMUSCULAR | Status: AC
  Filled 2013-03-13: qty 1

## 2013-03-13 MED ORDER — HYDROMORPHONE HCL PF 1 MG/ML IJ SOLN
1.0000 mg | Freq: Once | INTRAMUSCULAR | Status: AC
  Administered 2013-03-13: 1 mg via INTRAVENOUS

## 2013-03-13 MED ORDER — HYDROMORPHONE HCL PF 1 MG/ML IJ SOLN
1.0000 mg | Freq: Once | INTRAMUSCULAR | Status: AC
  Administered 2013-03-13: 1 mg via INTRAVENOUS
  Filled 2013-03-13: qty 1

## 2013-03-13 MED ORDER — KETOROLAC TROMETHAMINE 30 MG/ML IJ SOLN
30.0000 mg | Freq: Once | INTRAMUSCULAR | Status: AC
  Administered 2013-03-13: 30 mg via INTRAVENOUS
  Filled 2013-03-13: qty 1

## 2013-03-13 MED ORDER — ONDANSETRON HCL 4 MG/2ML IJ SOLN
4.0000 mg | Freq: Once | INTRAMUSCULAR | Status: DC
  Filled 2013-03-13: qty 2

## 2013-03-13 NOTE — ED Provider Notes (Signed)
CSN: [id number]     Arrival date & time 03/13/13  1138 History   First MD Initiated Contact with Patient 03/13/13 1215     Chief Complaint  Patient presents with  . Flank Pain   (Consider location/radiation/quality/duration/timing/severity/associated sxs/prior Treatment) Patient is a 57 y.o. female presenting with flank pain. The history is provided by the patient. No language interpreter was used.  Flank Pain This is a recurrent problem. The current episode started yesterday. The problem occurs constantly. The problem has been gradually worsening. Associated symptoms include abdominal pain. Nothing aggravates the symptoms. She has tried nothing for the symptoms. The treatment provided no relief.  Pt was diagnosed in August with a kidney stone.   Pt reports pain went away but has returned again.   Pt strained urine but did not see a stone  Past Medical History  Diagnosis Date  . Hyperlipidemia   . IBS (irritable bowel syndrome)     Gluten Free  . Nephrolithiasis   . Kidney stone     x's 2  . Polyp, sigmoid colon 01/29/2010  . Thyroid disease    Past Surgical History  Procedure Laterality Date  . Hemorrhoid banding      Dr Earlene Plater   Family History  Problem Relation Age of Onset  . Skin cancer Father     BCC  . Glaucoma Father   . Cataracts Father   . Diverticulitis Father   . Heart disease Father     Pacemaker for bradycardia/Carotid Enarterectomy  . Hypertension Father   . Diabetes Father   . Dementia      Maternal side  . Lung cancer    . Osteopenia Mother   . Colon polyps Mother   . Macular degeneration Mother   . Heart murmur Mother   . Heart disease Mother     pacemaker  . Arthritis Mother   . Thyroid nodules Sister   . Colon cancer Maternal Grandmother     ??? lived to be 100!!!  . Kidney disease Neg Hx   . Liver disease Neg Hx    History  Substance Use Topics  . Smoking status: Current Some Day Smoker    Types: Cigarettes  . Smokeless tobacco: Never  Used     Comment: form given 01-18-13  . Alcohol Use: Yes     Comment: Very Little   OB History   Grav Para Term Preterm Abortions TAB SAB Ect Mult Living                 Review of Systems  Gastrointestinal: Positive for abdominal pain.  Genitourinary: Positive for flank pain.  All other systems reviewed and are negative.    Allergies  Review of patient's allergies indicates no known allergies.  Home Medications   Current Outpatient Rx  Name  Route  Sig  Dispense  Refill  . hydrocortisone (ANUSOL-HC) 25 MG suppository   Rectal   Place 25 mg rectally 2 (two) times daily as needed.         . ondansetron (ZOFRAN) 4 MG tablet   Oral   Take 1 tablet (4 mg total) by mouth every 6 (six) hours.   12 tablet   0   . oxyCODONE-acetaminophen (PERCOCET/ROXICET) 5-325 MG per tablet   Oral   Take 1 tablet by mouth every 4 (four) hours as needed for pain.   15 tablet   0   . valACYclovir (VALTREX) 1000 MG tablet   Oral   Take 1,000 mg  by mouth 2 (two) times daily as needed.          BP 113/59  Pulse 72  Temp(Src) 97.8 F (36.6 C) (Oral)  Resp 16  Ht 5\' 1"  (1.549 m)  Wt 103 lb (46.72 kg)  BMI 19.47 kg/m2  SpO2 100% Physical Exam  Nursing note and vitals reviewed. Constitutional: She appears well-developed and well-nourished.  HENT:  Head: Normocephalic.  Eyes: Pupils are equal, round, and reactive to light.  Neck: Normal range of motion.  Cardiovascular: Normal rate, regular rhythm and normal heart sounds.   Pulmonary/Chest: Effort normal and breath sounds normal.  Abdominal: Soft. Bowel sounds are normal.  Musculoskeletal: Normal range of motion.  Neurological: She is alert.  Skin: There is erythema.    ED Course  Procedures (including critical care time) Labs Review Labs Reviewed  URINALYSIS, ROUTINE W REFLEX MICROSCOPIC - Abnormal; Notable for the following:    APPearance CLOUDY (*)    Hgb urine dipstick LARGE (*)    Ketones, ur 40 (*)    Leukocytes,  UA SMALL (*)    All other components within normal limits  URINE MICROSCOPIC-ADD ON - Abnormal; Notable for the following:    Bacteria, UA MANY (*)    Crystals CA OXALATE CRYSTALS (*)    All other components within normal limits  URINE CULTURE   Imaging Review No results found.  EKG Interpretation   None     I spoke with Dr. Marcello Fennel.   He advised torodol 30mg  IV.  Have pt go to [facility] ED if symptoms worsen or not relieved by medication.  Follow up as scheduled on tuesday  MDM   1. Kidney stone on right side    Flomax,  Percocet.   See the urologist as scheduled   Elson Areas, PA-C 03/13/13 1645

## 2013-03-13 NOTE — ED Provider Notes (Signed)
Medical screening examination/treatment/procedure(s) were performed by non-physician practitioner and as supervising physician I was immediately available for consultation/collaboration.   Zakira Ressel B. Kruz Chiu, MD 03/13/13 2031 

## 2013-03-13 NOTE — ED Notes (Signed)
Pt having right flank pain radiating to groin.  Some burning and irritation.

## 2013-03-14 LAB — URINE CULTURE
Colony Count: NO GROWTH
Culture: NO GROWTH

## 2013-03-16 ENCOUNTER — Telehealth: Payer: Self-pay | Admitting: Internal Medicine

## 2013-03-16 ENCOUNTER — Other Ambulatory Visit: Payer: Self-pay | Admitting: Urology

## 2013-03-16 NOTE — Telephone Encounter (Signed)
Spoke with Pam in [facility] urology regarding pt surgical clearance. Pam is aware that pt has never been seen in this office by a cardiologist, and that pt had an EKG done in March 2013 by  PCP Rosana Berger DO's office. Pam verbalized understanding.

## 2013-03-16 NOTE — Telephone Encounter (Signed)
New problem   Need cardiac clearance for pt to have sx tomorrow pt has a kidney stone.

## 2013-03-17 ENCOUNTER — Encounter (HOSPITAL_COMMUNITY): Payer: Self-pay | Admitting: *Deleted

## 2013-03-17 DIAGNOSIS — Z87442 Personal history of urinary calculi: Secondary | ICD-10-CM

## 2013-03-17 HISTORY — DX: Personal history of urinary calculi: Z87.442

## 2013-03-17 NOTE — Progress Notes (Addendum)
SAMEDAY SURGERY PLANNED - PT GIVEN PREOP INSTRUCTIONS AND CHLORHEXIDINE SHOWER INSTRUCTIONS / PRECAUTIONS BY PHONE. PT SAW CARDIOLOGIST DR. 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  ORDERS IN EPIC TO DO EKG AND CXR AND CBC PREOP PER ANESTHESIOLOGIST'S GUIDELINES.  PT HAD BMET IN ER ON 03/13/13 -REPORT IN EPIC.

## 2013-03-18 ENCOUNTER — Encounter (HOSPITAL_COMMUNITY): Payer: Self-pay | Admitting: *Deleted

## 2013-03-18 ENCOUNTER — Ambulatory Visit (HOSPITAL_COMMUNITY)
Admission: RE | Admit: 2013-03-18 | Discharge: 2013-03-18 | Disposition: A | Discharge status: Home / Self Care | Payer: BC Managed Care – PPO | Source: Ambulatory Visit | Attending: Urology | Admitting: Urology

## 2013-03-18 ENCOUNTER — Encounter (HOSPITAL_COMMUNITY)
Admission: RE | Disposition: A | Discharge status: Home / Self Care | Payer: Self-pay | Source: Ambulatory Visit | Attending: Urology

## 2013-03-18 ENCOUNTER — Ambulatory Visit (HOSPITAL_COMMUNITY): Payer: BC Managed Care – PPO | Admitting: Anesthesiology

## 2013-03-18 ENCOUNTER — Encounter (HOSPITAL_COMMUNITY): Payer: BC Managed Care – PPO | Admitting: Anesthesiology

## 2013-03-18 DIAGNOSIS — F17200 Nicotine dependence, unspecified, uncomplicated: Secondary | ICD-10-CM | POA: Insufficient documentation

## 2013-03-18 DIAGNOSIS — E039 Hypothyroidism, unspecified: Secondary | ICD-10-CM | POA: Insufficient documentation

## 2013-03-18 DIAGNOSIS — N201 Calculus of ureter: Secondary | ICD-10-CM | POA: Insufficient documentation

## 2013-03-18 DIAGNOSIS — N200 Calculus of kidney: Secondary | ICD-10-CM

## 2013-03-18 DIAGNOSIS — N1330 Unspecified hydronephrosis: Secondary | ICD-10-CM | POA: Insufficient documentation

## 2013-03-18 DIAGNOSIS — E7800 Pure hypercholesterolemia, unspecified: Secondary | ICD-10-CM | POA: Insufficient documentation

## 2013-03-18 HISTORY — DX: Pre-excitation syndrome: I45.6

## 2013-03-18 HISTORY — PX: CYSTOSCOPY WITH URETEROSCOPY, STONE BASKETRY AND STENT PLACEMENT: SHX6378

## 2013-03-18 HISTORY — DX: Personal history of urinary calculi: Z87.442

## 2013-03-18 HISTORY — DX: Family history of other specified conditions: Z84.89

## 2013-03-18 LAB — CBC
MCV: 88 fL (ref 78.0–100.0)
Platelets: 246 10*3/uL (ref 150–400)
RDW: 13 % (ref 11.5–15.5)
WBC: 7.4 10*3/uL (ref 4.0–10.5)

## 2013-03-18 SURGERY — CYSTOSCOPY, WITH CALCULUS MANIPULATION OR REMOVAL
Anesthesia: General | Laterality: Right | Wound class: Clean Contaminated

## 2013-03-18 MED ORDER — ACETAMINOPHEN-CODEINE #3 300-30 MG PO TABS: 1.0000 | ORAL_TABLET | ORAL | Status: DC | PRN

## 2013-03-18 MED ORDER — IOHEXOL 300 MG/ML  SOLN
INTRAMUSCULAR | Status: DC | PRN
  Administered 2013-03-18: 5 mL via URETHRAL

## 2013-03-18 MED ORDER — MIDAZOLAM HCL 5 MG/5ML IJ SOLN
INTRAMUSCULAR | Status: DC | PRN
  Administered 2013-03-18: 2 mg via INTRAVENOUS

## 2013-03-18 MED ORDER — PROMETHAZINE HCL 25 MG/ML IJ SOLN
6.2500 mg | INTRAMUSCULAR | Status: DC | PRN
  Administered 2013-03-18: 6.25 mg via INTRAVENOUS

## 2013-03-18 MED ORDER — LIDOCAINE HCL 2 % EX GEL
CUTANEOUS | Status: AC
  Filled 2013-03-18: qty 10

## 2013-03-18 MED ORDER — SODIUM CHLORIDE 0.9 % IR SOLN
Status: DC | PRN
  Administered 2013-03-18: 4000 mL

## 2013-03-18 MED ORDER — LIDOCAINE HCL 2 % EX GEL
CUTANEOUS | Status: DC | PRN
  Administered 2013-03-18: 1 via URETHRAL

## 2013-03-18 MED ORDER — LACTATED RINGERS IV SOLN: INTRAVENOUS | Status: DC

## 2013-03-18 MED ORDER — DEXAMETHASONE SODIUM PHOSPHATE 10 MG/ML IJ SOLN
INTRAMUSCULAR | Status: DC | PRN
  Administered 2013-03-18: 10 mg via INTRAVENOUS

## 2013-03-18 MED ORDER — CIPROFLOXACIN IN D5W 400 MG/200ML IV SOLN
400.0000 mg | INTRAVENOUS | Status: AC
  Administered 2013-03-18: 400 mg via INTRAVENOUS

## 2013-03-18 MED ORDER — FENTANYL CITRATE 0.05 MG/ML IJ SOLN
INTRAMUSCULAR | Status: DC | PRN
  Administered 2013-03-18: 50 ug via INTRAVENOUS

## 2013-03-18 MED ORDER — HYDROMORPHONE HCL PF 1 MG/ML IJ SOLN: 0.2500 mg | INTRAMUSCULAR | Status: DC | PRN

## 2013-03-18 MED ORDER — LIDOCAINE HCL (CARDIAC) 20 MG/ML IV SOLN
INTRAVENOUS | Status: DC | PRN
  Administered 2013-03-18: 100 mg via INTRAVENOUS

## 2013-03-18 MED ORDER — TROSPIUM CHLORIDE ER 60 MG PO CP24: 60.0000 mg | ORAL_CAPSULE | Freq: Every day | ORAL | Status: DC

## 2013-03-18 MED ORDER — LACTATED RINGERS IV SOLN
INTRAVENOUS | Status: DC | PRN
  Administered 2013-03-18: 12:00:00 via INTRAVENOUS

## 2013-03-18 MED ORDER — ONDANSETRON HCL 4 MG/2ML IJ SOLN
INTRAMUSCULAR | Status: DC | PRN
  Administered 2013-03-18: 4 mg via INTRAMUSCULAR

## 2013-03-18 MED ORDER — CIPROFLOXACIN IN D5W 400 MG/200ML IV SOLN
INTRAVENOUS | Status: AC
  Filled 2013-03-18: qty 200

## 2013-03-18 MED ORDER — PROPOFOL 10 MG/ML IV BOLUS
INTRAVENOUS | Status: DC | PRN
  Administered 2013-03-18: 120 mg via INTRAVENOUS

## 2013-03-18 MED ORDER — PROMETHAZINE HCL 25 MG/ML IJ SOLN
INTRAMUSCULAR | Status: AC
  Filled 2013-03-18: qty 1

## 2013-03-18 SURGICAL SUPPLY — 17 items
BAG URO CATCHER STRL LF (DRAPE) ×2 IMPLANT
BASKET ZERO TIP NITINOL 2.4FR (BASKET) ×2 IMPLANT
BSKT STON RTRVL ZERO TP 2.4FR (BASKET) ×1
CATH URET 5FR 28IN CONE TIP (BALLOONS)
CATH URET 5FR 28IN OPEN ENDED (CATHETERS) ×2 IMPLANT
CATH URET 5FR 70CM CONE TIP (BALLOONS) IMPLANT
CLOTH BEACON ORANGE TIMEOUT ST (SAFETY) ×2 IMPLANT
DRAPE CAMERA CLOSED 9X96 (DRAPES) ×2 IMPLANT
GLOVE BIOGEL M 8.0 STRL (GLOVE) ×6 IMPLANT
GOWN PREVENTION PLUS XLARGE (GOWN DISPOSABLE) IMPLANT
GOWN STRL REIN XL XLG (GOWN DISPOSABLE) ×4 IMPLANT
GUIDEWIRE STR DUAL SENSOR (WIRE) ×4 IMPLANT
MANIFOLD NEPTUNE II (INSTRUMENTS) ×2 IMPLANT
PACK CYSTO (CUSTOM PROCEDURE TRAY) ×2 IMPLANT
STENT CONTOUR 6FRX24X.038 (STENTS) ×2 IMPLANT
TUBING CONNECTING 10 (TUBING) ×2 IMPLANT
WIRE COONS/BENSON .038X145CM (WIRE) IMPLANT

## 2013-03-18 NOTE — Anesthesia Preprocedure Evaluation (Signed)
Anesthesia Evaluation  Patient identified by MRN, date of birth, ID band Patient awake    Reviewed: Allergy & Precautions, H&P , NPO status , Patient's Chart, lab work & pertinent test results  Airway Mallampati: II TM Distance: >3 FB Neck ROM: Full    Dental  (+) Teeth Intact and Dental Advisory Given   Pulmonary neg pulmonary ROS, Current Smoker,  breath sounds clear to auscultation  Pulmonary exam normal       Cardiovascular + dysrhythmias Rhythm:Regular Rate:Normal  EKG consistent with WPW; patient remains asymptomatic.   Neuro/Psych  Neuromuscular disease negative neurological ROS  negative psych ROS   GI/Hepatic negative GI ROS, Neg liver ROS,   Endo/Other  negative endocrine ROS  Renal/GU Renal disease  negative genitourinary   Musculoskeletal negative musculoskeletal ROS (+)   Abdominal   Peds negative pediatric ROS (+)  Hematology negative hematology ROS (+)   Anesthesia Other Findings Patient highly anxious and tearful; in a panic.  Reproductive/Obstetrics negative OB ROS                           Anesthesia Physical Anesthesia Plan  ASA: III  Anesthesia Plan: General   Post-op Pain Management:    Induction: Intravenous  Airway Management Planned:   Additional Equipment:   Intra-op Plan:   Post-operative Plan: Extubation in OR  Informed Consent: I have reviewed the patients History and Physical, chart, labs and discussed the procedure including the risks, benefits and alternatives for the proposed anesthesia with the patient or authorized representative who has indicated his/her understanding and acceptance.   Dental advisory given  Plan Discussed with: CRNA  Anesthesia Plan Comments:         Anesthesia Quick Evaluation

## 2013-03-18 NOTE — H&P (Signed)
  History of Present Illness  Patient follows up today with right-sided renal colic.  Her symptoms initially started the end of August and a CAT scan revealed a 3 mm distal right UVJ stone.  Her symptoms then resolved except for her microscopic hematuria.  I saw her in follow-up and she felt great.  No KUB was obtained at that point because of the location of the stone and the small size and the probability of not being able to see it.  However, the patient presented to urgent care about a week ago with ongoing and recurrent right-sided flank pain and gross hematuria.  Patient had significant nausea and vomiting for approximately 24 hours.  She continues to have nausea and pain.  A CAT scan was obtained which showed a 5 mm right UVJ stone with proximal hydronephrosis.  She presents today for follow-up after that CAT scan.  The patient denies any fevers, chills, or dysuria.   Past Medical History Problems  1. History of  Hypercholesterolemia 272.0 2. History of  Hypothyroidism 244.9  Surgical History Problems  1. History of  No Surgical Problems  Current Meds 1. Advil TABS; prn; Therapy: (Recorded:14Oct2014) to 2. Zofran TABS; Therapy: (Recorded:14Oct2014) to  Allergies Medication  1. No Known Drug Allergies  Family History Problems  1. Paternal history of  Diabetes Mellitus V18.0 2. Family history of  Family Health Status Number Of Children 1 son 1 daughter 3. Paternal history of  Nephrolithiasis  Social History Problems  1. Caffeine Use 1 per day 2. Marital History - Currently Married 3. Occupation: homemaker 4. Tobacco Use 305.1 2 or 3 cigs per day for 25 years Denied  5. History of  Alcohol Use  Vitals Vital Signs [Data Includes: Last 1 Day]  14Oct2014 01:18PM  Blood Pressure: 117 / 76 Temperature: 98.4 F Heart Rate: 83  Results/Data Urine [Data Includes: Last 1 Day]   14Oct2014  COLOR STRAW   APPEARANCE CLEAR   SPECIFIC GRAVITY <1.005   pH 5.5   GLUCOSE NEG  mg/dL  BILIRUBIN NEG   KETONE NEG mg/dL  BLOOD SMALL   PROTEIN NEG mg/dL  UROBILINOGEN 0.2 mg/dL  NITRITE NEG   LEUKOCYTE ESTERASE TRACE   SQUAMOUS EPITHELIAL/HPF RARE   WBC 3-6 WBC/hpf  RBC NONE SEEN RBC/hpf  BACTERIA RARE   CRYSTALS NONE SEEN   CASTS NONE SEEN     CT scan of her abdomen and pelvis noncontrast obtained 03/13/13: I personally reviewed these images which show a right approximately 5 mm distal UPJ stone with proximal hydro-.   Assessment  Right renal colic with a distal 5 mm obstructing stone   Plan Health Maintenance (V70.0)  1. UA With REFLEX  Done: 14Oct2014 01:08PM Nephrolithiasis (592.0)  2. Follow-up Schedule Surgery Office  Follow-up  Done: 14Oct2014  Discussion/Summary  We discussed the various options for management of a distal ureteral stone including medical expulsion therapy, shockwave lithotripsy, and ureteroscopy.  I told her that shockwave lithotripsy would be a difficult proposition given its location and our difficulty targeting a stone in the pelvis.  She is not anxious to medical expulsion therapy, she would like to move on with her life.  We've settled on distal ureteroscopy.  She understands the risks and benefits of the operation, including then need for stent placement following the procedure.  We will schedule her for first available appointment for the operating room.

## 2013-03-18 NOTE — Op Note (Signed)
Preoperative diagnosis: right ureteral calculus  Postoperative diagnosis: right ureteral calculus  Procedure:  1. Cystoscopy 2. right ureteroscopy and stone removal 3. Ureteroscopic laser lithotripsy 4. right 69F x 24cm ureteral stent placement  5. right retrograde pyelography with interpretation  Surgeon: Crist Fat, MD  Anesthesia: General  Complications: None  Intraoperative findings: right retrograde pyelography demonstrated a filling defect within the right ureter consistent with the patient's known calculus without other abnormalities.  EBL: Minimal  Specimens: 1. right ureteral calculus  Disposition of specimens: [facility] for stone analysis  Indication: Lauren Sanchez is a 57 y.o.   patient with urolithiasis. After reviewing the management options for treatment, the patient elected to proceed with the above surgical procedure(s). We have discussed the potential benefits and risks of the procedure, side effects of the proposed treatment, the likelihood of the patient achieving the goals of the procedure, and any potential problems that might occur during the procedure or recuperation. Informed consent has been obtained.  Description of procedure:  The patient was taken to the operating room and general anesthesia was induced.  The patient was placed in the dorsal lithotomy position, prepped and draped in the usual sterile fashion, and preoperative antibiotics were administered. A preoperative time-out was performed.   Cystourethroscopy was performed.  The patient's urethra was examined and was normal. The bladder was then systematically examined in its entirety. There was no evidence for any bladder tumors, stones, or other mucosal pathology.    Attention then turned to the right ureteral orifice and a ureteral catheter was used to intubate the ureteral orifice.  Omnipaque contrast was injected through the ureteral catheter and a retrograde pyelogram  was performed with findings as dictated above.  A 0.38 sensor guidewire was then advanced up the right ureter into the renal pelvis under fluoroscopic guidance. The 6 Fr semirigid ureteroscope was then advanced into the ureter next to the guidewire and the calculus was identified.  All stones were then removed from the ureter with a zero tip nitinol basket.  Reinspection of the ureter revealed no remaining visible stones or fragments.   The wire was then backloaded through the cystoscope and a ureteral stent was advance over the wire using Seldinger technique.  The stent was positioned appropriately under fluoroscopic and cystoscopic guidance.  The wire was then removed with an adequate stent curl noted in the renal pelvis as well as in the bladder.  The string was left attached to the string and then tucked into the vagina.  The bladder was then emptied and the procedure ended.  The patient appeared to tolerate the procedure well and without complications.  The patient was able to be awakened and transferred to the recovery unit in satisfactory condition.   Dispo: f/u in 5 days for stent removal.

## 2013-03-18 NOTE — Progress Notes (Signed)
CXR ordered as pre op  Pt refuses to have "any more x-rays"  She has chest reading on CT ABDOMEN RESULTS

## 2013-03-18 NOTE — Anesthesia Postprocedure Evaluation (Signed)
Anesthesia Post Note  Patient: Lauren Sanchez  Procedure(s) Performed: Procedure(s) (LRB): RIGHT URETEROSCOPY,  STONE REMOVAL AND STENT PLACEMENT, retrograde (Right)  Anesthesia type: General  Patient location: PACU  Post pain: Pain level controlled  Post assessment: Post-op Vital signs reviewed  Last Vitals:  Filed Vitals:   03/18/13 1350  BP: 116/75  Pulse:   Temp: 36.6 C  Resp:     Post vital signs: Reviewed  Level of consciousness: sedated  Complications: No apparent anesthesia complications

## 2013-03-18 NOTE — Progress Notes (Signed)
Short Stay Phase2 post op. Pt states she is shaving burning in perineum. Pt was able to ambulated to BR with minimal assist  voided moderate amount of very lightly pink urine  and states that the burning decreassed but then started again, Offered to call Dr Marlou Porch but she was asking if Tylenol would help. She decided she would prefer to go home and take Tylenol and also her prescription for TROSPIUM . Encouraged to call Dr.Herrick if the burning persisted. The 2nd time she walked to the BR st mentioned  Right sided flank" slight pain"and expressed some concern about this. Again I encouraged her to contact Dr Marlou Porch if this persisted. Pt and husband verbalized understanding

## 2013-03-18 NOTE — Transfer of Care (Signed)
Immediate Anesthesia Transfer of Care Note  Patient: Lauren Sanchez  Procedure(s) Performed: Procedure(s): RIGHT URETEROSCOPY,  STONE REMOVAL AND STENT PLACEMENT, retrograde (Right)  Patient Location: PACU  Anesthesia Type:General  Level of Consciousness: sedated  Airway & Oxygen Therapy: Patient Spontanous Breathing and Patient connected to face mask oxygen  Post-op Assessment: Report given to PACU RN and Post -op Vital signs reviewed and stable  Post vital signs: Reviewed and stable  Complications: No apparent anesthesia complications

## 2013-03-19 ENCOUNTER — Encounter (HOSPITAL_COMMUNITY): Payer: Self-pay | Admitting: Urology

## 2013-04-08 ENCOUNTER — Other Ambulatory Visit: Payer: Self-pay

## 2013-05-12 ENCOUNTER — Other Ambulatory Visit: Payer: Self-pay

## 2013-05-12 DIAGNOSIS — Z1231 Encounter for screening mammogram for malignant neoplasm of breast: Secondary | ICD-10-CM

## 2013-06-08 ENCOUNTER — Encounter (HOSPITAL_COMMUNITY): Payer: Self-pay | Admitting: Emergency Medicine

## 2013-06-08 ENCOUNTER — Emergency Department (HOSPITAL_COMMUNITY)
Admission: EM | Admit: 2013-06-08 | Discharge: 2013-06-08 | Disposition: A | Discharge status: Home / Self Care | Payer: BC Managed Care – PPO | Attending: Emergency Medicine | Admitting: Emergency Medicine

## 2013-06-08 ENCOUNTER — Emergency Department (HOSPITAL_COMMUNITY): Payer: BC Managed Care – PPO

## 2013-06-08 DIAGNOSIS — K5900 Constipation, unspecified: Secondary | ICD-10-CM | POA: Insufficient documentation

## 2013-06-08 DIAGNOSIS — R109 Unspecified abdominal pain: Secondary | ICD-10-CM

## 2013-06-08 DIAGNOSIS — F17200 Nicotine dependence, unspecified, uncomplicated: Secondary | ICD-10-CM | POA: Insufficient documentation

## 2013-06-08 DIAGNOSIS — Z8639 Personal history of other endocrine, nutritional and metabolic disease: Secondary | ICD-10-CM | POA: Insufficient documentation

## 2013-06-08 DIAGNOSIS — R1032 Left lower quadrant pain: Secondary | ICD-10-CM | POA: Insufficient documentation

## 2013-06-08 DIAGNOSIS — Z862 Personal history of diseases of the blood and blood-forming organs and certain disorders involving the immune mechanism: Secondary | ICD-10-CM | POA: Insufficient documentation

## 2013-06-08 DIAGNOSIS — Z79899 Other long term (current) drug therapy: Secondary | ICD-10-CM | POA: Insufficient documentation

## 2013-06-08 DIAGNOSIS — Z87442 Personal history of urinary calculi: Secondary | ICD-10-CM | POA: Insufficient documentation

## 2013-06-08 DIAGNOSIS — Z9889 Other specified postprocedural states: Secondary | ICD-10-CM | POA: Insufficient documentation

## 2013-06-08 DIAGNOSIS — R112 Nausea with vomiting, unspecified: Secondary | ICD-10-CM | POA: Insufficient documentation

## 2013-06-08 DIAGNOSIS — R319 Hematuria, unspecified: Secondary | ICD-10-CM | POA: Insufficient documentation

## 2013-06-08 DIAGNOSIS — Z860100 Personal history of colon polyps, unspecified: Secondary | ICD-10-CM | POA: Insufficient documentation

## 2013-06-08 DIAGNOSIS — Z8679 Personal history of other diseases of the circulatory system: Secondary | ICD-10-CM | POA: Insufficient documentation

## 2013-06-08 LAB — URINALYSIS, ROUTINE W REFLEX MICROSCOPIC
BILIRUBIN URINE: NEGATIVE
Glucose, UA: NEGATIVE mg/dL
KETONES UR: 40 mg/dL — AB
Leukocytes, UA: NEGATIVE
Nitrite: NEGATIVE
Protein, ur: NEGATIVE mg/dL
SPECIFIC GRAVITY, URINE: 1.014 (ref 1.005–1.030)
UROBILINOGEN UA: 0.2 mg/dL (ref 0.0–1.0)
pH: 8 (ref 5.0–8.0)

## 2013-06-08 LAB — CBC WITH DIFFERENTIAL/PLATELET
Basophils Absolute: 0 10*3/uL (ref 0.0–0.1)
Basophils Relative: 0 % (ref 0–1)
EOS PCT: 0 % (ref 0–5)
Eosinophils Absolute: 0 10*3/uL (ref 0.0–0.7)
HEMATOCRIT: 37.5 % (ref 36.0–46.0)
Hemoglobin: 12.9 g/dL (ref 12.0–15.0)
LYMPHS PCT: 9 % — AB (ref 12–46)
Lymphs Abs: 1.2 10*3/uL (ref 0.7–4.0)
MCH: 30.6 pg (ref 26.0–34.0)
MCHC: 34.4 g/dL (ref 30.0–36.0)
MCV: 88.9 fL (ref 78.0–100.0)
MONO ABS: 0.4 10*3/uL (ref 0.1–1.0)
Monocytes Relative: 3 % (ref 3–12)
NEUTROS ABS: 11.2 10*3/uL — AB (ref 1.7–7.7)
Neutrophils Relative %: 87 % — ABNORMAL HIGH (ref 43–77)
Platelets: 229 10*3/uL (ref 150–400)
RBC: 4.22 MIL/uL (ref 3.87–5.11)
RDW: 12.7 % (ref 11.5–15.5)
WBC: 12.9 10*3/uL — AB (ref 4.0–10.5)

## 2013-06-08 LAB — COMPREHENSIVE METABOLIC PANEL
ALK PHOS: 65 U/L (ref 39–117)
ALT: 13 U/L (ref 0–35)
AST: 20 U/L (ref 0–37)
Albumin: 3.8 g/dL (ref 3.5–5.2)
BILIRUBIN TOTAL: 0.3 mg/dL (ref 0.3–1.2)
BUN: 20 mg/dL (ref 6–23)
CALCIUM: 9.6 mg/dL (ref 8.4–10.5)
CHLORIDE: 100 meq/L (ref 96–112)
CO2: 25 mEq/L (ref 19–32)
Creatinine, Ser: 0.78 mg/dL (ref 0.50–1.10)
GLUCOSE: 159 mg/dL — AB (ref 70–99)
Potassium: 3.9 mEq/L (ref 3.7–5.3)
Sodium: 139 mEq/L (ref 137–147)
Total Protein: 6.5 g/dL (ref 6.0–8.3)

## 2013-06-08 LAB — URINE MICROSCOPIC-ADD ON

## 2013-06-08 MED ORDER — SODIUM CHLORIDE 0.9 % IV BOLUS (SEPSIS)
1000.0000 mL | Freq: Once | INTRAVENOUS | Status: AC
  Administered 2013-06-08: 1000 mL via INTRAVENOUS

## 2013-06-08 MED ORDER — HYDROMORPHONE HCL PF 1 MG/ML IJ SOLN
1.0000 mg | Freq: Once | INTRAMUSCULAR | Status: AC
  Administered 2013-06-08: 1 mg via INTRAVENOUS
  Filled 2013-06-08: qty 1

## 2013-06-08 MED ORDER — ONDANSETRON HCL 4 MG/2ML IJ SOLN
4.0000 mg | Freq: Once | INTRAMUSCULAR | Status: AC
  Administered 2013-06-08: 4 mg via INTRAVENOUS
  Filled 2013-06-08: qty 2

## 2013-06-08 MED ORDER — OXYCODONE-ACETAMINOPHEN 5-325 MG PO TABS: 1.0000 | ORAL_TABLET | Freq: Four times a day (QID) | ORAL | Status: DC | PRN

## 2013-06-08 MED ORDER — NAPROXEN 500 MG PO TABS: 500.0000 mg | ORAL_TABLET | Freq: Two times a day (BID) | ORAL | Status: DC

## 2013-06-08 NOTE — Discharge Instructions (Signed)
Please read and follow all provided instructions.  Your diagnoses today include:  1. Flank pain   2. Hematuria     Tests performed today include:  Urine test that showed blood in your urine and no infection  Ultrasound showing no swelling of the kidney  Blood test that showed normal kidney function  Vital signs. See below for your results today.   Medications prescribed:   Percocet (oxycodone/acetaminophen) - narcotic pain medication  DO NOT drive or perform any activities that require you to be awake and alert because this medicine can make you drowsy. BE VERY CAREFUL not to take multiple medicines containing Tylenol (also called acetaminophen). Doing so can lead to an overdose which can damage your liver and cause liver failure and possibly death.   Naproxen - anti-inflammatory pain medication  Do not exceed 500mg  naproxen every 12 hours, take with food  You have been prescribed an anti-inflammatory medication or NSAID. Take with food. Take smallest effective dose for the shortest duration needed for your pain. Stop taking if you experience stomach pain or vomiting.   Take any prescribed medications only as directed.  Home care instructions:  Follow any educational materials contained in this packet.  Please double your fluid intake for the next several days. Strain your urine and save any stones that may pass.   BE VERY CAREFUL not to take multiple medicines containing Tylenol (also called acetaminophen). Doing so can lead to an overdose which can damage your liver and cause liver failure and possibly death.   Follow-up instructions: Please follow-up with your urologist in the next 1 week for further evaluation of your symptoms.  If you need to return to the Emergency Department, go to [facility] and not [facility]. The urologists are located at [facility] and can better care for you at this location.  If you do not have a primary care doctor -- see  below for referral information.   Return instructions:  If you need to return to the Emergency Department, go to [facility] and not [facility]. The urologists are located at [facility] and can better care for you at this location.   Please return to the Emergency Department if you experience worsening symptoms.  Please return if you develop fever or uncontrolled pain or vomiting.  Please return if you have any other emergent concerns.  Additional Information:  Your vital signs today were: BP 96/55   Pulse 93   Temp(Src) 98.8 F (37.1 C) (Oral)   Resp 16   SpO2 100% If your blood pressure (BP) was elevated above 135/85 this visit, please have this repeated by your doctor within one month. --------------

## 2013-06-08 NOTE — ED Notes (Addendum)
Per EMS. Patient began to experience abdominal pain this morning. Patient has had vomiting and nausea episodes. Pain started in the Left Upper Quadrant and radiates the right. Patient has not had a bowel movement in 2 days and is experiencing decreased urine output. Patient given 4mg  of Zofran by EMS. Patient with Hx of cancer. Recent Hx of D&C and still having some vaginal bleeding. Patient is having a high level of stress.

## 2013-06-08 NOTE — ED Provider Notes (Signed)
CSN: [id number]     Arrival date & time 06/08/13  0920 History   First MD Initiated Contact with Patient 06/08/13 [phone]     Chief Complaint  Patient presents with  . Abdominal Pain   (Consider location/radiation/quality/duration/timing/severity/associated sxs/prior Treatment) HPI Comments: Patient with past history of diverticulosis, recent D&C due to concern of uterine cancer, kidney stone  -- presents with acute onset of left lower abdominal pain at approximately 5 AM. Patient was awakened from sleep. She has had several episodes of nausea and vomiting. She reports constipation, no diarrhea. She states that she feels dehydrated. No fever. She has not had symptoms like this in the past. The onset of this condition was acute. The course is constant. Aggravating factors: none. Alleviating factors: none.    Patient is a 58 y.o. female presenting with abdominal pain. The history is provided by the patient.  Abdominal Pain Associated symptoms: constipation, nausea and vomiting   Associated symptoms: no chest pain, no cough, no diarrhea, no dysuria, no fever, no sore throat, no vaginal bleeding and no vaginal discharge     Past Medical History  Diagnosis Date  . Hyperlipidemia   . IBS (irritable bowel syndrome)     Gluten Free  . Polyp, sigmoid colon 01/29/2010  . Thyroid disease     nodule-gluten free diet and thyroid blood studies w/in normal past 8 monts- is followed by endocrinologist  . WPW (Wolff-Parkinson-White syndrome)     DOCUMENTED ON EKG'S DATING BACK TO 2011--PT SAW CARDIOLOGIST DR. ALLRED IN 2011 - HAD STRESS TEST "NONDIAGNOSTIC FOR ISCHEMIA DUE TO PRE-EXCITATION"  "NO ARRHYTHMIAS DURING EXERCISE".  PT STATES  NOT AWARE OF ANY PROBLEMS FROM WPW - HER LAST EKG WAS 08/25/12 WITH DR. Etter Sjogren SHOWING WPW SYNDROME  . Family history of anesthesia complication     PT STATES HER MOTHER IS 90 YRS OLD - BUT HAD SOME PROBLEM IN HER 20'S OR 30'S WHEN PUT UNDER FOR ANESTHESIA .  PT PLANS TO CALL  HER MOTHER AND ASK HER IF SHE HAS MORE DETAILS.  Marland Kitchen History of kidney stones 03/17/13    STATES KIDNEY STONE FOR PAST 2 MONTHS - CAUSING A LOT OF PAIN AND N&V-UNABLE TO PASS STONE.  PREVIOUS HX OF STONES   Past Surgical History  Procedure Laterality Date  . Hemorrhoid banding      Dr Rosana Hoes- done in [place] office  . Cystoscopy with ureteroscopy, stone basketry and stent placement Right 03/18/2013    Procedure: RIGHT URETEROSCOPY,  STONE REMOVAL AND STENT PLACEMENT, retrograde;  Surgeon: Ardis Hughs, MD;  Location: [facility];  Service: Urology;  Laterality: Right;   Family History  Problem Relation Age of Onset  . Skin cancer Father     BCC  . Glaucoma Father   . Cataracts Father   . Diverticulitis Father   . Heart disease Father     Pacemaker for bradycardia/Carotid Enarterectomy  . Hypertension Father   . Diabetes Father   . Dementia      Maternal side  . Lung cancer    . Osteopenia Mother   . Colon polyps Mother   . Macular degeneration Mother   . Heart murmur Mother   . Heart disease Mother     pacemaker  . Arthritis Mother   . Thyroid nodules Sister   . Colon cancer Maternal Grandmother     ??? lived to be 24!!!  . Kidney disease Neg Hx   . Liver disease Neg Hx    History  Substance Use Topics  . Smoking status: Current Some Day Smoker -- 0.25 packs/day for 30 years    Types: Cigarettes  . Smokeless tobacco: Never Used     Comment: form given 01-18-13  . Alcohol Use: Yes     Comment: Very Little   OB History   Grav Para Term Preterm Abortions TAB SAB Ect Mult Living                 Review of Systems  Constitutional: Negative for fever.  HENT: Negative for rhinorrhea and sore throat.   Eyes: Negative for redness.  Respiratory: Negative for cough.   Cardiovascular: Negative for chest pain.  Gastrointestinal: Positive for nausea, vomiting, abdominal pain and constipation. Negative for diarrhea and blood in stool.  Genitourinary: Negative for dysuria,  vaginal bleeding and vaginal discharge.  Musculoskeletal: Negative for myalgias.  Skin: Negative for rash.  Neurological: Negative for headaches.    Allergies  Rapaflo and Tamsulosin  Home Medications   Current Outpatient Rx  Name  Route  Sig  Dispense  Refill  . ondansetron (ZOFRAN) 4 MG tablet   Oral   Take 1 tablet (4 mg total) by mouth every 6 (six) hours.   12 tablet   0   . Trospium Chloride 60 MG CP24   Oral   Take 1 capsule (60 mg total) by mouth daily.   14 each   0   . valACYclovir (VALTREX) 1000 MG tablet   Oral   Take 1,000 mg by mouth 2 (two) times daily as needed (fever blisters).           BP 117/63  Pulse 52  Temp(Src) 94.9 F (34.9 C) (Axillary)  Resp 20  SpO2 100% Physical Exam  Nursing note and vitals reviewed. Constitutional: She appears well-developed and well-nourished.  HENT:  Head: Normocephalic and atraumatic.  Eyes: Conjunctivae are normal. Right eye exhibits no discharge. Left eye exhibits no discharge.  Neck: Normal range of motion. Neck supple.  Cardiovascular: Normal rate, regular rhythm and normal heart sounds.   Pulmonary/Chest: Effort normal and breath sounds normal.  Abdominal: Soft. She exhibits no distension. There is tenderness in the suprapubic area and left lower quadrant. There is no rigidity, no rebound, no guarding, no tenderness at McBurney's point and negative Murphy's sign.    Neurological: She is alert.  Skin: Skin is warm and dry.  Psychiatric: She has a normal mood and affect.    ED Course  Procedures (including critical care time) Labs Review Labs Reviewed  CBC WITH DIFFERENTIAL - Abnormal; Notable for the following:    WBC 12.9 (*)    Neutrophils Relative % 87 (*)    Neutro Abs 11.2 (*)    Lymphocytes Relative 9 (*)    All other components within normal limits  COMPREHENSIVE METABOLIC PANEL - Abnormal; Notable for the following:    Glucose, Bld 159 (*)    All other components within normal limits    URINALYSIS, ROUTINE W REFLEX MICROSCOPIC - Abnormal; Notable for the following:    APPearance CLOUDY (*)    Hgb urine dipstick LARGE (*)    Ketones, ur 40 (*)    All other components within normal limits  URINE MICROSCOPIC-ADD ON - Abnormal; Notable for the following:    Bacteria, UA FEW (*)    All other components within normal limits   Imaging Review US Renal  06/08/2013   CLINICAL DATA:  Left-sided flank pain and hematuria.  EXAM: RENAL/URINARY TRACT ULTRASOUND COMPLETE  COMPARISON:  Prior study at [facility] dated 04/27/2013  FINDINGS: Right Kidney:  Length: 10.3 cm. Echogenicity within normal limits. No mass or hydronephrosis visualized. No shadowing calculi are visualized.  Left Kidney:  Length: 12.4 cm. Echogenicity within normal limits. Duplicated collecting system. No mass or hydronephrosis visualized. Small echogenic focus in the mid kidney likely represents a nonobstructing calculus.  Bladder:  Appears normal for degree of bladder distention.  Incidental fluid is noted in the endometrial cavity of the uterus. This is nonspecific.  IMPRESSION: Unremarkable renal ultrasound without evidence of hydronephrosis. Small echogenic focus in the mid left kidney likely represents a nonobstructing calculus. Incidental nonspecific fluid in the endometrial canal of the uterus.   Electronically Signed   By: Aletta Edouard M.D.   On: 06/08/2013 14:58    EKG Interpretation   None      9:51 AM Patient seen and examined. Work-up initiated. Medications ordered.   Vital signs reviewed and are as follows: Filed Vitals:   06/08/13 0922  BP: 117/63  Pulse: 52  Temp: 94.9 F (34.9 C)  Resp: 20   3:43 PM Patient much improved with treatment. Labs and [place] reviewed with Dr. Kathrynn Humble.   On re-exam prior to discharge, patient has mild return of L flank pain. She states it is now reminding her more of kidney stone pain. She is tolerating PO's. Will give additional pain medication as she has safe  ride home. She is to f/u with PCP/urologist. The patient was urged to return to the Emergency Department immediately with worsening of current symptoms, worsening abdominal pain, persistent vomiting, blood noted in stools, fever, or any other concerns. The patient verbalized understanding.   Patient counseled on use of narcotic pain medications. Counseled not to combine these medications with others containing tylenol. Urged not to drink alcohol, drive, or perform any other activities that requires focus while taking these medications. The patient verbalizes understanding and agrees with the plan.    MDM   1. Flank pain   2. Hematuria    Patient with L lower abd/flank pain. Labs and [place] are reassuring. Pt does have blood in urine but no infection. She is adamant about no CT today due to multiple recent CT scans. Pain is much better and she is feeling much better. Low suspicion for diverticulitis but possible, no fever however. No rebound or guarding. Feel close observation with supportive care indicated. Patient agrees with this plan. Appropriate return instructions given. No indication that pain is complication from recent D&C.     Carlisle Cater, PA-C 06/08/13 1551

## 2013-06-10 NOTE — ED Provider Notes (Signed)
Medical screening examination/treatment/procedure(s) were performed by non-physician practitioner and as supervising physician I was immediately available for consultation/collaboration.  EKG Interpretation   None        Varney Biles, MD 06/10/13 2216

## 2013-06-15 ENCOUNTER — Ambulatory Visit
Admission: RE | Admit: 2013-06-15 | Discharge: 2013-06-15 | Disposition: A | Discharge status: Home / Self Care | Payer: BC Managed Care – PPO | Source: Ambulatory Visit

## 2013-06-15 DIAGNOSIS — Z1231 Encounter for screening mammogram for malignant neoplasm of breast: Secondary | ICD-10-CM

## 2013-07-22 ENCOUNTER — Other Ambulatory Visit: Payer: Self-pay

## 2013-07-22 MED ORDER — VALACYCLOVIR HCL 1 G PO TABS: 1000.0000 mg | ORAL_TABLET | Freq: Two times a day (BID) | ORAL | Status: DC | PRN

## 2013-10-15 ENCOUNTER — Telehealth: Payer: Self-pay | Admitting: Family Medicine

## 2013-10-15 NOTE — Telephone Encounter (Signed)
Caller name:  Azul Relation to pt: Call back [phone]   Reason for call:  Pt wants to know her blood type.

## 2013-10-18 NOTE — Telephone Encounter (Signed)
MSG left to call the office      KP 

## 2013-10-19 NOTE — Telephone Encounter (Signed)
Patient has been made aware that we do have her blood type available, she can contact her GYN. Previous Psychologist, sport and exercise or [employer]. She voiced understanding and agreed to try.    KP

## 2014-09-20 ENCOUNTER — Other Ambulatory Visit: Payer: Self-pay

## 2014-09-20 DIAGNOSIS — Z1231 Encounter for screening mammogram for malignant neoplasm of breast: Secondary | ICD-10-CM

## 2014-09-23 ENCOUNTER — Ambulatory Visit
Admission: RE | Admit: 2014-09-23 | Discharge: 2014-09-23 | Disposition: A | Discharge status: Home / Self Care | Payer: BLUE CROSS/BLUE SHIELD | Source: Ambulatory Visit

## 2014-09-23 DIAGNOSIS — Z1231 Encounter for screening mammogram for malignant neoplasm of breast: Secondary | ICD-10-CM

## 2015-11-08 ENCOUNTER — Other Ambulatory Visit: Payer: Self-pay | Admitting: Obstetrics and Gynecology

## 2015-11-08 DIAGNOSIS — Z1231 Encounter for screening mammogram for malignant neoplasm of breast: Secondary | ICD-10-CM

## 2015-11-21 ENCOUNTER — Ambulatory Visit
Admission: RE | Admit: 2015-11-21 | Discharge: 2015-11-21 | Disposition: A | Discharge status: Home / Self Care | Payer: BLUE CROSS/BLUE SHIELD | Source: Ambulatory Visit | Attending: Obstetrics and Gynecology | Admitting: Obstetrics and Gynecology

## 2015-11-21 DIAGNOSIS — Z1231 Encounter for screening mammogram for malignant neoplasm of breast: Secondary | ICD-10-CM

## 2016-02-11 ENCOUNTER — Emergency Department (HOSPITAL_BASED_OUTPATIENT_CLINIC_OR_DEPARTMENT_OTHER): Payer: BLUE CROSS/BLUE SHIELD

## 2016-02-11 ENCOUNTER — Emergency Department (HOSPITAL_BASED_OUTPATIENT_CLINIC_OR_DEPARTMENT_OTHER)
Admission: EM | Admit: 2016-02-11 | Discharge: 2016-02-11 | Disposition: A | Discharge status: Home / Self Care | Payer: BLUE CROSS/BLUE SHIELD | Attending: Physician Assistant | Admitting: Physician Assistant

## 2016-02-11 ENCOUNTER — Encounter (HOSPITAL_BASED_OUTPATIENT_CLINIC_OR_DEPARTMENT_OTHER): Payer: Self-pay | Admitting: *Deleted

## 2016-02-11 DIAGNOSIS — N200 Calculus of kidney: Secondary | ICD-10-CM | POA: Diagnosis not present

## 2016-02-11 DIAGNOSIS — F17210 Nicotine dependence, cigarettes, uncomplicated: Secondary | ICD-10-CM | POA: Insufficient documentation

## 2016-02-11 DIAGNOSIS — R1030 Lower abdominal pain, unspecified: Secondary | ICD-10-CM | POA: Diagnosis present

## 2016-02-11 DIAGNOSIS — Z79899 Other long term (current) drug therapy: Secondary | ICD-10-CM | POA: Diagnosis not present

## 2016-02-11 DIAGNOSIS — R109 Unspecified abdominal pain: Secondary | ICD-10-CM

## 2016-02-11 LAB — CBC WITH DIFFERENTIAL/PLATELET
Basophils Absolute: 0 10*3/uL (ref 0.0–0.1)
Basophils Relative: 0 %
EOS ABS: 0.1 10*3/uL (ref 0.0–0.7)
EOS PCT: 1 %
HCT: 37 % (ref 36.0–46.0)
Hemoglobin: 12.4 g/dL (ref 12.0–15.0)
LYMPHS ABS: 1.7 10*3/uL (ref 0.7–4.0)
Lymphocytes Relative: 15 %
MCH: 29.8 pg (ref 26.0–34.0)
MCHC: 33.5 g/dL (ref 30.0–36.0)
MCV: 88.9 fL (ref 78.0–100.0)
MONO ABS: 0.8 10*3/uL (ref 0.1–1.0)
MONOS PCT: 7 %
Neutro Abs: 9.1 10*3/uL — ABNORMAL HIGH (ref 1.7–7.7)
Neutrophils Relative %: 77 %
PLATELETS: 232 10*3/uL (ref 150–400)
RBC: 4.16 MIL/uL (ref 3.87–5.11)
RDW: 13.2 % (ref 11.5–15.5)
WBC: 11.7 10*3/uL — ABNORMAL HIGH (ref 4.0–10.5)

## 2016-02-11 LAB — URINALYSIS, ROUTINE W REFLEX MICROSCOPIC
Bilirubin Urine: NEGATIVE
Glucose, UA: NEGATIVE mg/dL
Ketones, ur: 15 mg/dL — AB
NITRITE: NEGATIVE
PROTEIN: NEGATIVE mg/dL
Specific Gravity, Urine: 1.008 (ref 1.005–1.030)
pH: 5 (ref 5.0–8.0)

## 2016-02-11 LAB — URINE MICROSCOPIC-ADD ON: Squamous Epithelial / LPF: NONE SEEN

## 2016-02-11 LAB — BASIC METABOLIC PANEL
Anion gap: 8 (ref 5–15)
BUN: 15 mg/dL (ref 6–20)
CHLORIDE: 99 mmol/L — AB (ref 101–111)
CO2: 26 mmol/L (ref 22–32)
CREATININE: 0.66 mg/dL (ref 0.44–1.00)
Calcium: 9.4 mg/dL (ref 8.9–10.3)
GFR calc Af Amer: >60 mL/min (ref >60)
GFR calc non Af Amer: >60 mL/min (ref >60)
Glucose, Bld: 111 mg/dL — ABNORMAL HIGH (ref 65–99)
Potassium: 3.7 mmol/L (ref 3.5–5.1)
Sodium: 133 mmol/L — ABNORMAL LOW (ref 135–145)

## 2016-02-11 MED ORDER — KETOROLAC TROMETHAMINE 30 MG/ML IJ SOLN
15.0000 mg | Freq: Once | INTRAMUSCULAR | Status: AC
  Administered 2016-02-11: 15 mg via INTRAVENOUS
  Filled 2016-02-11: qty 1

## 2016-02-11 MED ORDER — ONDANSETRON HCL 4 MG/2ML IJ SOLN
4.0000 mg | Freq: Once | INTRAMUSCULAR | Status: AC
  Administered 2016-02-11: 4 mg via INTRAVENOUS
  Filled 2016-02-11: qty 2

## 2016-02-11 MED ORDER — HYDROMORPHONE HCL 1 MG/ML IJ SOLN
0.5000 mg | Freq: Once | INTRAMUSCULAR | Status: AC
  Administered 2016-02-11: 0.5 mg via INTRAVENOUS
  Filled 2016-02-11: qty 1

## 2016-02-11 MED ORDER — OXYCODONE-ACETAMINOPHEN 5-325 MG PO TABS: 1.0000 | ORAL_TABLET | Freq: Four times a day (QID) | ORAL | 0 refills | Status: DC | PRN

## 2016-02-11 NOTE — ED Notes (Signed)
Pt alert, NAD, calm, interactive, resps e/u, speaking in clear complete sentences, family at [facility], meds given, to CT.

## 2016-02-11 NOTE — ED Provider Notes (Signed)
[facility] Provider Note   CSN: [id number] Arrival date & time: 02/11/16  1815 By signing my name below, I, Dyke Brackett, attest that this documentation has been prepared under the direction and in the presence of non-physician practitioner, Montine Circle, PA-C Electronically Signed: Dyke Brackett, Scribe. 02/11/2016. 7:20 PM.   History   Chief Complaint Chief Complaint  Patient presents with  . Flank Pain    HPI Lauren Sanchez is a 60 y.o. female with hx of kidney stones who presents to the Emergency Department complaining of moderate suprapubic pain. Pt describes her pain as sharp and stabbing. She states she initially had pain one week ago and felt as if she had a kidney stone. Pt felt as if she was getting a kidney stone and drank a lot of water until the symptoms passed a few days later. Today, her pain has returned, now with radiation into her groin. Pt states her current pain feels similar to her kidney stones, but without any back pain. She notes associated dysuria, urgency, and decreased urine output. Pt is currently followed buy [facility]. Her last kidney stone was 2 1/2 years ago which required surgery.Pt had a D&C in in July of 2017. She denies any hx of decreased kidney function or increased creatine. She denies any back pain.    The history is provided by the patient. No language interpreter was used.   Past Medical History:  Diagnosis Date  . Family history of anesthesia complication    PT STATES HER MOTHER IS 90 YRS OLD - BUT HAD SOME PROBLEM IN HER 20'S OR 30'S WHEN PUT UNDER FOR ANESTHESIA .  PT PLANS TO CALL HER MOTHER AND ASK HER IF SHE HAS MORE DETAILS.  Marland Kitchen History of kidney stones 03/17/13   STATES KIDNEY STONE FOR PAST 2 MONTHS - CAUSING A LOT OF PAIN AND N&V-UNABLE TO PASS STONE.  PREVIOUS HX OF STONES  . Hyperlipidemia   . IBS (irritable bowel syndrome)    Gluten Free  . Polyp, sigmoid colon 01/29/2010  . Thyroid disease    nodule-gluten  free diet and thyroid blood studies w/in normal past 8 monts- is followed by endocrinologist  . WPW (Wolff-Parkinson-White syndrome)    DOCUMENTED ON EKG'S DATING BACK TO 2011--PT SAW CARDIOLOGIST DR. ALLRED IN 2011 - HAD STRESS TEST "NONDIAGNOSTIC FOR ISCHEMIA DUE TO PRE-EXCITATION"  "NO ARRHYTHMIAS DURING EXERCISE".  PT STATES  NOT AWARE OF ANY PROBLEMS FROM WPW - HER LAST EKG WAS 08/25/12 WITH DR. Etter Sjogren SHOWING WPW SYNDROME    Patient Active Problem List   Diagnosis Date Noted  . Hematuria 01/14/2013  . Nephrolithiasis 12/25/2012  . Bruising 12/01/2012  . MYALGIA 03/09/2010  . WOLFF (WOLFE)-PARKINSON-WHITE (WPW) SYNDROME 11/06/2009  . HYPERLIPIDEMIA 07/14/2008  . HEMORRHOIDS, WITH BLEEDING 06/16/2008    Past Surgical History:  Procedure Laterality Date  . CYSTOSCOPY WITH URETEROSCOPY, STONE BASKETRY AND STENT PLACEMENT Right 03/18/2013   Procedure: RIGHT URETEROSCOPY,  STONE REMOVAL AND STENT PLACEMENT, retrograde;  Surgeon: Ardis Hughs, MD;  Location: [facility];  Service: Urology;  Laterality: Right;  . DILATION AND CURETTAGE OF UTERUS     x 2  . HEMORRHOID BANDING     Dr Rosana Hoes- done in [place] office    OB History    No data available      Home Medications    Prior to Admission medications   Medication Sig Start Date End Date Taking? Authorizing Provider  cholecalciferol (VITAMIN D) 1000 UNITS tablet Take 50,000  Units by mouth every morning.     Historical Provider, MD  naproxen (NAPROSYN) 500 MG tablet Take 1 tablet (500 mg total) by mouth 2 (two) times daily. 06/08/13   Carlisle Cater, PA-C  oxyCODONE-acetaminophen (PERCOCET/ROXICET) 5-325 MG per tablet Take 1-2 tablets by mouth every 6 (six) hours as needed for severe pain. 06/08/13   Carlisle Cater, PA-C  valACYclovir (VALTREX) 1000 MG tablet Take 1 tablet (1,000 mg total) by mouth 2 (two) times daily as needed (fever blisters). 07/22/13   Rosalita Chessman Chase, DO  VITAMIN K, PHYTONADIONE, PO Take 2 capsules by mouth  every morning.    Historical Provider, MD    Family History Family History  Problem Relation Age of Onset  . Skin cancer Father     BCC  . Glaucoma Father   . Cataracts Father   . Diverticulitis Father   . Heart disease Father     Pacemaker for bradycardia/Carotid Enarterectomy  . Hypertension Father   . Diabetes Father   . Osteopenia Mother   . Colon polyps Mother   . Macular degeneration Mother   . Heart murmur Mother   . Heart disease Mother     pacemaker  . Arthritis Mother   . Thyroid nodules Sister   . Dementia      Maternal side  . Lung cancer    . Colon cancer Maternal Grandmother     ??? lived to be 5!!!  . Kidney disease Neg Hx   . Liver disease Neg Hx     Social History Social History  Substance Use Topics  . Smoking status: Current Some Day Smoker    Packs/day: 0.25    Years: 30.00    Types: Cigarettes  . Smokeless tobacco: Never Used     Comment: form given 01-18-13  . Alcohol use Yes     Comment: Very Little     Allergies   Rapaflo [silodosin] and Tamsulosin   Review of Systems Review of Systems  Constitutional: Negative for fever.  Gastrointestinal: Positive for abdominal pain.  Genitourinary: Positive for decreased urine volume, dysuria and frequency.  Musculoskeletal: Negative for back pain.  All other systems reviewed and are negative.  Physical Exam Updated Vital Signs BP (!) 127/54 (BP Location: Left Arm)   Pulse 82   Temp 97.9 F (36.6 C) (Oral)   Resp 18   Ht 5\' 1"  (1.549 m)   Wt 102 lb (46.3 kg)   SpO2 100%   BMI 19.27 kg/m   Physical Exam  Constitutional: She is oriented to person, place, and time. She appears well-developed and well-nourished. No distress.  HENT:  Head: Normocephalic and atraumatic.  Eyes: Conjunctivae and EOM are normal. Pupils are equal, round, and reactive to light.  Neck: Normal range of motion. Neck supple.  Cardiovascular: Normal rate and regular rhythm.  Exam reveals no gallop and no  friction rub.   No murmur heard. Pulmonary/Chest: Effort normal and breath sounds normal. No respiratory distress. She has no wheezes. She has no rales. She exhibits no tenderness.  Abdominal: Soft. Bowel sounds are normal. She exhibits no distension and no mass. There is no tenderness. There is no rebound and no guarding.  No focal abdominal tenderness, no RLQ tenderness or pain at McBurney's point, no RUQ tenderness or Murphy's sign, no left-sided abdominal tenderness, no fluid wave, or signs of peritonitis   Musculoskeletal: Normal range of motion. She exhibits no edema or tenderness.  Neurological: She is alert and oriented to person,  place, and time.  Skin: Skin is warm and dry.  Psychiatric: She has a normal mood and affect. Her behavior is normal. Judgment and thought content normal.  Nursing note and vitals reviewed.   ED Treatments / Results  DIAGNOSTIC STUDIES:  Oxygen Saturation is 100% on RA, normal by my interpretation.    COORDINATION OF CARE:  7:15 PM Will order CT renal stone study. Discussed treatment plan with pt at bedside and pt agreed to plan.   Labs (all labs ordered are listed, but only abnormal results are displayed) Labs Reviewed  URINALYSIS, ROUTINE W REFLEX MICROSCOPIC (NOT AT [facility])    EKG  EKG Interpretation None       Radiology Ct Renal Stone Study  Result Date: 02/11/2016 CLINICAL DATA:  Moderate suprapubic pain and left flank pain. EXAM: CT ABDOMEN AND PELVIS WITHOUT CONTRAST TECHNIQUE: Multidetector CT imaging of the abdomen and pelvis was performed following the standard protocol without IV contrast. COMPARISON:  03/13/2013 FINDINGS: Lower chest: No acute abnormality. Hepatobiliary: No focal liver abnormality is seen. No gallstones, gallbladder wall thickening, or biliary dilatation. Pancreas: Unremarkable. No pancreatic ductal dilatation or surrounding inflammatory changes. Spleen: Normal in size without focal abnormality. Adrenals/Urinary  Tract: Normal adrenal glands. Several small stones are identified within the mid and inferior pole of the right kidney. No right hydronephrosis or mass. 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 Obstruction of the upper pole collecting system by a 5 mm stone at the level of the UPJ is noted. The inferior pole collecting system appears normal. The urinary bladder is unremarkable. Stomach/Bowel: The stomach is within normal limits. The small bowel loops have a normal course and caliber. No obstruction. Normal appearance of the colon. The appendix is visualized and appears normal. Vascular/Lymphatic: No significant vascular findings are present. No enlarged abdominal or pelvic lymph nodes. Reproductive: Left posterior uterine fibroid measures 3.8 cm. No adnexal mass. Other: No abdominal wall hernia or abnormality. No abdominopelvic ascites. Musculoskeletal: Lumbar spondylosis. IMPRESSION: 1. 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 Electronically Signed   By: Kerby Moors M.D.   On: 02/11/2016 20:18    Procedures Procedures (including critical care time)  Medications Ordered in ED Medications - No data to display   Initial Impression / Assessment and Plan / ED Course  I have reviewed the triage vital signs and the nursing notes.  Pertinent labs & imaging results that were available during my care of the patient were reviewed by me and considered in my medical decision making (see chart for details).  Clinical Course   Patient with left flank pain.  Hx of kidney stones.    Pain well controlled.  CT as above.  UA not consistent with UTI.  Has close urology follow-up.  Not vomiting. Stable for discharge.   Final Clinical Impressions(s) / ED Diagnoses   Final diagnoses:  Flank pain  Kidney stone   I personally performed the services described in this documentation, which was scribed in my presence.  The recorded information has been reviewed and is accurate.     New Prescriptions Discharge Medication List as of 02/11/2016  9:48 PM       Montine Circle, PA-C 02/11/16 [place], MD 02/11/16 2321

## 2016-02-11 NOTE — ED Notes (Signed)
Back from CT, up to b/r, steady gait  

## 2016-02-11 NOTE — ED Notes (Signed)
Pt states left lower abdominal cramping and urinary frequency started today.  Pt h/o kidney stones but denies any back pain at present.  Pt states urgency and pain with urination intermittently for past week.

## 2016-02-11 NOTE — ED Triage Notes (Signed)
Patient states she had had lower abdominal pain approximately one week ago, and felt she was getting a kidney stone.  Drank lots of water and the symptoms passed in a few days.  States this morning her pain returned in the left flank area with radiation into the left groin.  States she had drank a lot of water and is having urgency with small amounts of urine return.

## 2016-02-11 NOTE — ED Notes (Signed)
Robert browning, PA-C in room with pt now.

## 2016-02-15 ENCOUNTER — Other Ambulatory Visit: Payer: Self-pay | Admitting: Urology

## 2016-02-16 ENCOUNTER — Encounter (HOSPITAL_COMMUNITY): Payer: Self-pay | Admitting: *Deleted

## 2016-02-16 NOTE — Progress Notes (Signed)
Spoke with patient to interview for ESWL. Patient extremely upset and crying. about office visit yesterday and quiet angry and upset. States she is not sure if she wants to have this procedure and has a multitude of questions. She is to go to [facility] to pick of [place]. I suggested she tell them she needs to speak with doctor to have questions answered so she can make an informed decision about rather to have this procedure or not.

## 2016-02-19 ENCOUNTER — Ambulatory Visit (HOSPITAL_COMMUNITY): Payer: BLUE CROSS/BLUE SHIELD

## 2016-02-19 ENCOUNTER — Ambulatory Visit (HOSPITAL_COMMUNITY): Admission: RE | Admit: 2016-02-19 | Payer: BLUE CROSS/BLUE SHIELD | Source: Ambulatory Visit | Admitting: Urology

## 2016-02-19 HISTORY — DX: Chronic kidney disease, unspecified: N18.9

## 2016-02-19 SURGERY — LITHOTRIPSY, ESWL
Anesthesia: LOCAL | Laterality: Left

## 2016-02-19 NOTE — H&P (Signed)
f/u for obstructing stone  HPI: Lauren Sanchez is a 60 year-old female established patient who is here for further eval and management of an obstructing stone.  The patient was last seen Jan 2015.   The patient's stone is on her right side. The stone was 54mm in the proximal right ureter. There are additional stones within the urinary tract. They are located bilateral small punctate stones.   The patient has not passed their stone since their last visit. The patient is complaining of groin pain. The patient denies fevers, chills, nausea, vomiting, flank pain, groin pain, or progressive voiding symptoms. The patient underwent KUB prior to today's appointment.   The patient was seen in the ED 1 week prior and diagnosed with a 5 mm left proximal ureteral stone. Her pain was easily controlled and she was discharged home with close follow-up. The patient denies any dysuria, hematuria, fevers or chills.     ALLERGIES: No Allergies    MEDICATIONS: Hydrocortisone 2.5 % External Cream External  Vitamin D TABS Oral  Vitamin K TABS Oral     GU PSH: Cysto Uretero Lithotripsy - 2014 Cystoscopy Insert Stent - 2014      PSH Notes: Cystoscopy With Ureteroscopy With Lithotripsy, Cystoscopy With Insertion Of Ureteral Stent Right, No Surgical Problems   NON-GU PSH: None   GU PMH: Calculus Ureter, Left ureteral calculus - 2015 Other microscopic hematuria, Microscopic hematuria - 2015 Personal Hx urinary calculi, History of renal calculi - 2014    NON-GU PMH: Leiomyoma of uterus, unspecified, Fibroid, uterine - 2015 Encounter for general adult medical examination without abnormal findings, Encounter for preventive health examination - 2014 Personal history of other endocrine, nutritional and metabolic disease, History of hypothyroidism - 2014, History of hypercholesterolemia, - 2014    FAMILY HISTORY: Diabetes - Father Family Health Status Number - Runs In Family nephrolithiasis - Father   SOCIAL  HISTORY: Marital Status: Married Current Smoking Status: Patient smokes.  Does not drink anymore.  Drinks 1 caffeinated drink per day.     Notes: Current some day smoker, Tobacco use, Caffeine Use, Alcohol Use, Occupation:, Marital History - Currently Married   REVIEW OF SYSTEMS:    GU Review Female:   Patient reports frequent urination and burning /pain with urination. Patient denies hard to postpone urination, get up at night to urinate, leakage of urine, stream starts and stops, trouble starting your stream, have to strain to urinate, and currently pregnant.  Gastrointestinal (Upper):   Patient reports nausea. Patient denies vomiting and indigestion/ heartburn.  Gastrointestinal (Lower):   Patient denies constipation and diarrhea.  Constitutional:   Patient denies fever, night sweats, weight loss, and fatigue.  Skin:   Patient denies skin rash/ lesion and itching.  Eyes:   Patient denies blurred vision and double vision.  Ears/ Nose/ Throat:   Patient reports sore throat. Patient denies sinus problems.  Hematologic/Lymphatic:   Patient denies swollen glands and easy bruising.  Cardiovascular:   Patient denies leg swelling and chest pains.  Respiratory:   Patient denies cough and shortness of breath.  Endocrine:   Patient denies excessive thirst.  Musculoskeletal:   Patient denies back pain and joint pain.  Neurological:   Patient denies headaches and dizziness.  Psychologic:   Patient denies depression and anxiety.   VITAL SIGNS:      02/15/2016 01:01 PM  Weight 102 lb / 46.27 kg  Height 61 in / 154.94 cm  BP 115/71 mmHg  Pulse 72 /min  BMI 19.3  kg/m   MULTI-SYSTEM PHYSICAL EXAMINATION:    Constitutional: Well-nourished. No physical deformities. Normally developed. Good grooming.  Respiratory: No labored breathing, no use of accessory muscles. CTA-B  Cardiovascular: Normal temperature, normal extremity pulses, no swelling, no varicosities. RRR  Musculoskeletal: Normal gait and  station of head and neck.     PAST DATA REVIEWED:  Source Of History:  Patient  X-Ray Review: C.T. Abdomen/Pelvis: Reviewed Films. Discussed With Patient. The patient has a 6 mm 12 mm stone in the proximal left ureter. She has bilateral nonobstructing sounds which are significantly smaller.    PROCEDURES:         KUB - 74000  A single view of the abdomen is obtained. The renal poorly shadows were visualized - there were no calcifications within the renal shadows. The patient's abdomen is somewhat obscured by significant amount of stool and gas but I do appreciate the patient's stone in the expected location of the left proximal ureter. There are no boney structure abnormalities       The patient's stone is visualized just distal to L2 on the left. It has been marked with an arrow sign.   ASSESSMENT:      ICD-10 Details  1 GU:   Calculus Kidney and Ureter - N20.2 Left, Stable, Acute - The patient has a large stone in the left proximal ureter that is unlikely to pass on its own. I recommended definitive management versus medical expulsion therapy.   PLAN:           Orders Labs Urine Culture and Sensitivity  X-Rays: KUB          Schedule         Document Letter(s):  Created for Patient: Clinical Summary    We discussed management options including medical expulsion therapy, shockwave lithotripsy, and ureteroscopy. Ultimately, the patient has opted for shock wave lithotripsy. I discussed with the patient the procedure in detail as well as the risk and benefits. The patient is aware that she may need additional procedures. She also is aware of the risks of hematoma and pain. We will try to get this patient's scheduled as soon as possible.         Notes:   The patient's stone is visualized just distal to L2 on the left. It has been marked with an arrow sign.   When the patient follows up with me in 3 months, I would like to have a 24-hour urine collection performed prior so that  we can review this with her at that time.

## 2016-02-20 ENCOUNTER — Other Ambulatory Visit: Payer: Self-pay | Admitting: Urology

## 2016-02-21 ENCOUNTER — Encounter (HOSPITAL_COMMUNITY): Payer: Self-pay

## 2016-02-21 ENCOUNTER — Encounter (HOSPITAL_COMMUNITY)
Admission: RE | Admit: 2016-02-21 | Discharge: 2016-02-21 | Disposition: A | Discharge status: Home / Self Care | Payer: BLUE CROSS/BLUE SHIELD | Source: Ambulatory Visit | Attending: Urology | Admitting: Urology

## 2016-02-21 DIAGNOSIS — M1990 Unspecified osteoarthritis, unspecified site: Secondary | ICD-10-CM | POA: Diagnosis not present

## 2016-02-21 DIAGNOSIS — N201 Calculus of ureter: Secondary | ICD-10-CM | POA: Diagnosis not present

## 2016-02-21 DIAGNOSIS — Z841 Family history of disorders of kidney and ureter: Secondary | ICD-10-CM | POA: Diagnosis not present

## 2016-02-21 DIAGNOSIS — F17200 Nicotine dependence, unspecified, uncomplicated: Secondary | ICD-10-CM | POA: Diagnosis not present

## 2016-02-21 HISTORY — DX: Nontoxic single thyroid nodule: E04.1

## 2016-02-21 HISTORY — DX: Other complications of anesthesia, initial encounter: T88.59XA

## 2016-02-21 HISTORY — DX: Unspecified osteoarthritis, unspecified site: M19.90

## 2016-02-21 HISTORY — DX: Adverse effect of unspecified anesthetic, initial encounter: T41.45XA

## 2016-02-21 LAB — BASIC METABOLIC PANEL
Anion gap: 8 (ref 5–15)
BUN: 13 mg/dL (ref 6–20)
CHLORIDE: 105 mmol/L (ref 101–111)
CO2: 28 mmol/L (ref 22–32)
CREATININE: 0.67 mg/dL (ref 0.44–1.00)
Calcium: 9.6 mg/dL (ref 8.9–10.3)
GFR calc non Af Amer: >60 mL/min (ref >60)
Glucose, Bld: 110 mg/dL — ABNORMAL HIGH (ref 65–99)
Potassium: 3.8 mmol/L (ref 3.5–5.1)
SODIUM: 141 mmol/L (ref 135–145)

## 2016-02-21 LAB — CBC
HCT: 40.8 % (ref 36.0–46.0)
Hemoglobin: 13.2 g/dL (ref 12.0–15.0)
MCH: 29.3 pg (ref 26.0–34.0)
MCHC: 32.4 g/dL (ref 30.0–36.0)
MCV: 90.7 fL (ref 78.0–100.0)
PLATELETS: 239 10*3/uL (ref 150–400)
RBC: 4.5 MIL/uL (ref 3.87–5.11)
RDW: 13.7 % (ref 11.5–15.5)
WBC: 6.5 10*3/uL (ref 4.0–10.5)

## 2016-02-21 NOTE — Patient Instructions (Signed)
Lauren Sanchez  02/21/2016   Your procedure is scheduled on: 02/23/2016    Report to [facility] Main  Entrance take [place]  elevators to 3rd floor to  [place] at    [phone].  Call this number if you have problems the morning of surgery [phone]   Remember: ONLY 1 PERSON MAY GO WITH YOU TO SHORT STAY TO GET  READY MORNING OF YOUR SURGERY.  Do not eat food or drink liquids :After Midnight.     Take these medicines the morning of surgery with A SIP OF WATER: none                                 You may not have any metal on your body including hair pins and              piercings  Do not wear jewelry, make-up, lotions, powders or perfumes, deodorant             Do not wear nail polish.  Do not shave  48 hours prior to surgery.     Do not bring valuables to the hospital. [place].  Contacts, dentures or bridgework may not be worn into surgery.      Patients discharged the day of surgery will not be allowed to drive home.  Name and phone number of your driver:  Special Instructions: N/A              Please read over the following fact sheets you were given: _____________________________________________________________________             [facility] - Preparing for Surgery Before surgery, you can play an important role.  Because skin is not sterile, your skin needs to be as free of germs as possible.  You can reduce the number of germs on your skin by washing with CHG (chlorahexidine gluconate) soap before surgery.  CHG is an antiseptic cleaner which kills germs and bonds with the skin to continue killing germs even after washing. Please DO NOT use if you have an allergy to CHG or antibacterial soaps.  If your skin becomes reddened/irritated stop using the CHG and inform your nurse when you arrive at Short Stay. Do not shave (including legs and underarms) for at least 48 hours prior to the first CHG  shower.  You may shave your face/neck. Please follow these instructions carefully:  1.  Shower with CHG Soap the night before surgery and the  morning of Surgery.  2.  If you choose to wash your hair, wash your hair first as usual with your  normal  shampoo.  3.  After you shampoo, rinse your hair and body thoroughly to remove the  shampoo.                           4.  Use CHG as you would any other liquid soap.  You can apply chg directly  to the skin and wash                       Gently with a scrungie or clean washcloth.  5.  Apply the CHG Soap to your  body ONLY FROM THE NECK DOWN.   Do not use on face/ open                           Wound or open sores. Avoid contact with eyes, ears mouth and genitals (private parts).                       Wash face,  Genitals (private parts) with your normal soap.             6.  Wash thoroughly, paying special attention to the area where your surgery  will be performed.  7.  Thoroughly rinse your body with warm water from the neck down.  8.  DO NOT shower/wash with your normal soap after using and rinsing off  the CHG Soap.                9.  Pat yourself dry with a clean towel.            10.  Wear clean pajamas.            11.  Place clean sheets on your bed the night of your first shower and do not  sleep with pets. Day of Surgery : Do not apply any lotions/deodorants the morning of surgery.  Please wear clean clothes to the hospital/surgery center.  FAILURE TO FOLLOW THESE INSTRUCTIONS MAY RESULT IN THE CANCELLATION OF YOUR SURGERY PATIENT SIGNATURE_________________________________  NURSE SIGNATURE__________________________________  ________________________________________________________________________

## 2016-02-22 ENCOUNTER — Encounter (HOSPITAL_COMMUNITY): Payer: Self-pay | Admitting: Certified Registered Nurse Anesthetist

## 2016-02-22 NOTE — Progress Notes (Signed)
Final EKG done 02/21/16- EPIC.

## 2016-02-23 ENCOUNTER — Ambulatory Visit (HOSPITAL_COMMUNITY): Payer: BLUE CROSS/BLUE SHIELD | Admitting: Certified Registered Nurse Anesthetist

## 2016-02-23 ENCOUNTER — Ambulatory Visit (HOSPITAL_COMMUNITY): Payer: BLUE CROSS/BLUE SHIELD

## 2016-02-23 ENCOUNTER — Ambulatory Visit (HOSPITAL_COMMUNITY)
Admission: RE | Admit: 2016-02-23 | Discharge: 2016-02-23 | Disposition: A | Discharge status: Home / Self Care | Payer: BLUE CROSS/BLUE SHIELD | Source: Ambulatory Visit | Attending: Urology | Admitting: Urology

## 2016-02-23 ENCOUNTER — Encounter (HOSPITAL_COMMUNITY)
Admission: RE | Disposition: A | Discharge status: Home / Self Care | Payer: Self-pay | Source: Ambulatory Visit | Attending: Urology

## 2016-02-23 ENCOUNTER — Encounter (HOSPITAL_COMMUNITY): Payer: Self-pay | Admitting: *Deleted

## 2016-02-23 DIAGNOSIS — Z419 Encounter for procedure for purposes other than remedying health state, unspecified: Secondary | ICD-10-CM

## 2016-02-23 DIAGNOSIS — N201 Calculus of ureter: Secondary | ICD-10-CM | POA: Diagnosis not present

## 2016-02-23 DIAGNOSIS — Z841 Family history of disorders of kidney and ureter: Secondary | ICD-10-CM | POA: Insufficient documentation

## 2016-02-23 DIAGNOSIS — M1990 Unspecified osteoarthritis, unspecified site: Secondary | ICD-10-CM | POA: Insufficient documentation

## 2016-02-23 DIAGNOSIS — F17200 Nicotine dependence, unspecified, uncomplicated: Secondary | ICD-10-CM | POA: Insufficient documentation

## 2016-02-23 DIAGNOSIS — N200 Calculus of kidney: Secondary | ICD-10-CM

## 2016-02-23 HISTORY — PX: CYSTOSCOPY WITH RETROGRADE PYELOGRAM, URETEROSCOPY AND STENT PLACEMENT: SHX5789

## 2016-02-23 SURGERY — CYSTOURETEROSCOPY, WITH RETROGRADE PYELOGRAM AND STENT INSERTION
Anesthesia: General

## 2016-02-23 MED ORDER — 0.9 % SODIUM CHLORIDE (POUR BTL) OPTIME
TOPICAL | Status: DC | PRN
  Administered 2016-02-23: 1000 mL

## 2016-02-23 MED ORDER — SODIUM CHLORIDE 0.9 % IR SOLN
Status: DC | PRN
  Administered 2016-02-23: 3000 mL

## 2016-02-23 MED ORDER — MIDAZOLAM HCL 2 MG/2ML IJ SOLN
INTRAMUSCULAR | Status: AC
  Filled 2016-02-23: qty 2

## 2016-02-23 MED ORDER — FENTANYL CITRATE (PF) 100 MCG/2ML IJ SOLN
INTRAMUSCULAR | Status: AC
  Filled 2016-02-23: qty 2

## 2016-02-23 MED ORDER — LIDOCAINE 2% (20 MG/ML) 5 ML SYRINGE
INTRAMUSCULAR | Status: DC | PRN
  Administered 2016-02-23: 80 mg via INTRAVENOUS

## 2016-02-23 MED ORDER — FENTANYL CITRATE (PF) 100 MCG/2ML IJ SOLN
INTRAMUSCULAR | Status: DC | PRN
  Administered 2016-02-23: 25 ug via INTRAVENOUS
  Administered 2016-02-23: 50 ug via INTRAVENOUS
  Administered 2016-02-23: 25 ug via INTRAVENOUS

## 2016-02-23 MED ORDER — LIDOCAINE 2% (20 MG/ML) 5 ML SYRINGE
INTRAMUSCULAR | Status: AC
  Filled 2016-02-23: qty 5

## 2016-02-23 MED ORDER — LIDOCAINE HCL 2 % EX GEL
CUTANEOUS | Status: AC
  Filled 2016-02-23: qty 5

## 2016-02-23 MED ORDER — PHENYLEPHRINE 40 MCG/ML (10ML) SYRINGE FOR IV PUSH (FOR BLOOD PRESSURE SUPPORT)
PREFILLED_SYRINGE | INTRAVENOUS | Status: DC | PRN
  Administered 2016-02-23 (×4): 40 ug via INTRAVENOUS
  Administered 2016-02-23 (×2): 80 ug via INTRAVENOUS
  Administered 2016-02-23 (×3): 40 ug via INTRAVENOUS

## 2016-02-23 MED ORDER — TRAMADOL HCL 50 MG PO TABS
100.0000 mg | ORAL_TABLET | Freq: Four times a day (QID) | ORAL | Status: DC | PRN
  Administered 2016-02-23: 100 mg via ORAL
  Filled 2016-02-23 (×2): qty 2

## 2016-02-23 MED ORDER — MIDAZOLAM HCL 5 MG/5ML IJ SOLN
INTRAMUSCULAR | Status: DC | PRN
  Administered 2016-02-23: 2 mg via INTRAVENOUS

## 2016-02-23 MED ORDER — ONDANSETRON HCL 4 MG/2ML IJ SOLN
INTRAMUSCULAR | Status: AC
  Filled 2016-02-23: qty 2

## 2016-02-23 MED ORDER — DEXAMETHASONE SODIUM PHOSPHATE 10 MG/ML IJ SOLN
INTRAMUSCULAR | Status: AC
  Filled 2016-02-23: qty 1

## 2016-02-23 MED ORDER — FENTANYL CITRATE (PF) 100 MCG/2ML IJ SOLN
25.0000 ug | INTRAMUSCULAR | Status: DC | PRN
  Administered 2016-02-23 (×2): 50 ug via INTRAVENOUS

## 2016-02-23 MED ORDER — LACTATED RINGERS IV SOLN
INTRAVENOUS | Status: DC
  Administered 2016-02-23 (×2): via INTRAVENOUS

## 2016-02-23 MED ORDER — DEXAMETHASONE SODIUM PHOSPHATE 10 MG/ML IJ SOLN
INTRAMUSCULAR | Status: DC | PRN
  Administered 2016-02-23: 10 mg via INTRAVENOUS

## 2016-02-23 MED ORDER — CIPROFLOXACIN IN D5W 400 MG/200ML IV SOLN
INTRAVENOUS | Status: AC
  Filled 2016-02-23: qty 200

## 2016-02-23 MED ORDER — BELLADONNA ALKALOIDS-OPIUM 16.2-60 MG RE SUPP
RECTAL | Status: DC | PRN
  Administered 2016-02-23: 1 via RECTAL

## 2016-02-23 MED ORDER — ONDANSETRON HCL 4 MG/2ML IJ SOLN
INTRAMUSCULAR | Status: DC | PRN
  Administered 2016-02-23: 4 mg via INTRAVENOUS

## 2016-02-23 MED ORDER — BELLADONNA ALKALOIDS-OPIUM 16.2-60 MG RE SUPP
RECTAL | Status: AC
  Filled 2016-02-23: qty 1

## 2016-02-23 MED ORDER — TRAMADOL HCL 50 MG PO TABS: 50.0000 mg | ORAL_TABLET | Freq: Four times a day (QID) | ORAL | 0 refills | Status: DC | PRN

## 2016-02-23 MED ORDER — SODIUM CHLORIDE 0.9 % IR SOLN
Status: DC | PRN
  Administered 2016-02-23: 1000 mL

## 2016-02-23 MED ORDER — PHENAZOPYRIDINE HCL 200 MG PO TABS: 200.0000 mg | ORAL_TABLET | ORAL | Status: DC | PRN

## 2016-02-23 MED ORDER — PHENYLEPHRINE 40 MCG/ML (10ML) SYRINGE FOR IV PUSH (FOR BLOOD PRESSURE SUPPORT)
PREFILLED_SYRINGE | INTRAVENOUS | Status: AC
  Filled 2016-02-23: qty 20

## 2016-02-23 MED ORDER — PROPOFOL 10 MG/ML IV BOLUS
INTRAVENOUS | Status: DC | PRN
  Administered 2016-02-23: 120 mg via INTRAVENOUS

## 2016-02-23 MED ORDER — PROMETHAZINE HCL 25 MG/ML IJ SOLN: 6.2500 mg | INTRAMUSCULAR | Status: DC | PRN

## 2016-02-23 MED ORDER — LIDOCAINE HCL 2 % EX GEL
CUTANEOUS | Status: DC | PRN
  Administered 2016-02-23: 1

## 2016-02-23 MED ORDER — PROPOFOL 10 MG/ML IV BOLUS
INTRAVENOUS | Status: AC
  Filled 2016-02-23: qty 20

## 2016-02-23 MED ORDER — IOHEXOL 300 MG/ML  SOLN
INTRAMUSCULAR | Status: DC | PRN
  Administered 2016-02-23: 33 mL via INTRAVENOUS

## 2016-02-23 MED ORDER — CIPROFLOXACIN IN D5W 400 MG/200ML IV SOLN
400.0000 mg | INTRAVENOUS | Status: AC
  Administered 2016-02-23: 400 mg via INTRAVENOUS

## 2016-02-23 MED ORDER — PHENAZOPYRIDINE HCL 200 MG PO TABS: 200.0000 mg | ORAL_TABLET | Freq: Three times a day (TID) | ORAL | 0 refills | Status: DC | PRN

## 2016-02-23 SURGICAL SUPPLY — 28 items
BAG URO CATCHER STRL LF (MISCELLANEOUS) ×4 IMPLANT
BASKET DAKOTA 1.9FR 11X120 (BASKET) IMPLANT
BASKET ZERO TIP NITINOL 2.4FR (BASKET) IMPLANT
BSKT STON RTRVL ZERO TP 2.4FR (BASKET)
CATH URET 5FR 28IN OPEN ENDED (CATHETERS) ×4 IMPLANT
CATH URET DUAL LUMEN 6-10FR 50 (CATHETERS) ×4 IMPLANT
CLOTH BEACON ORANGE TIMEOUT ST (SAFETY) ×4 IMPLANT
FIBER LASER FLEXIVA 1000 (UROLOGICAL SUPPLIES) IMPLANT
FIBER LASER FLEXIVA 365 (UROLOGICAL SUPPLIES) IMPLANT
FIBER LASER FLEXIVA 550 (UROLOGICAL SUPPLIES) IMPLANT
FIBER LASER TRAC TIP (UROLOGICAL SUPPLIES) IMPLANT
GLOVE BIOGEL M STRL SZ7.5 (GLOVE) ×4 IMPLANT
GOWN STRL REUS W/TWL XL LVL3 (GOWN DISPOSABLE) ×8 IMPLANT
GUIDEWIRE ANG ZIPWIRE 035X150 (WIRE) ×4 IMPLANT
GUIDEWIRE ANG ZIPWIRE 038X150 (WIRE) ×4 IMPLANT
GUIDEWIRE STR DUAL SENSOR (WIRE) ×4 IMPLANT
KIT BALLIN UROMAX 15FX10 (LABEL) ×2 IMPLANT
MANIFOLD NEPTUNE II (INSTRUMENTS) ×4 IMPLANT
PACK CYSTO (CUSTOM PROCEDURE TRAY) ×4 IMPLANT
SCOPE LITHOVU DISP 9.5FR 7.7FR (UROLOGICAL SUPPLIES) ×2 IMPLANT
SCOPE LITHOVUE DISPOSABLE (UROLOGICAL SUPPLIES) ×4
SET HIGH PRES BAL DIL (LABEL) ×2
SHEATH ACCESS URETERAL 24CM (SHEATH) IMPLANT
SHEATH ACCESS URETERAL 38CM (SHEATH) ×4 IMPLANT
SHEATH ACCESS URETERAL 54CM (SHEATH) IMPLANT
STENT URET 6FRX24 CONTOUR (STENTS) ×4 IMPLANT
TUBING CONNECTING 10 (TUBING) ×3 IMPLANT
TUBING CONNECTING 10' (TUBING) ×1

## 2016-02-23 NOTE — Anesthesia Procedure Notes (Signed)
Procedure Name: LMA Insertion Date/Time: 02/23/2016 10:40 AM Performed by: Montel Clock Pre-anesthesia Checklist: Patient identified, Emergency Drugs available, Suction available, Patient being monitored and Timeout performed Patient Re-evaluated:Patient Re-evaluated prior to inductionOxygen Delivery Method: Circle system utilized Preoxygenation: Pre-oxygenation with 100% oxygen Intubation Type: IV induction Ventilation: Mask ventilation without difficulty LMA: LMA with gastric port inserted LMA Size: 3.0 Number of attempts: 1 Dental Injury: Teeth and Oropharynx as per pre-operative assessment

## 2016-02-23 NOTE — H&P (Signed)
f/u for obstructing stone   HPI: Lauren Sanchez is a 60 year-old female established patient who is here for further eval and management of an obstructing stone.  The patient was last seen Jan 2015.   The patient's stone is on her right side. The stone was 61mm in the proximal right ureter. There are additional stones within the urinary tract. They are located bilateral small punctate stones.   The patient has not passed their stone since their last visit. The patient is complaining of groin pain. The patient denies fevers, chills, nausea, vomiting, flank pain, groin pain, or progressive voiding symptoms. The patient underwent KUB prior to today's appointment.   The patient was seen in the ED 1 week prior and diagnosed with a 5 mm left proximal ureteral stone. Her pain was easily controlled and she was discharged home with close follow-up. The patient denies any dysuria, hematuria, fevers or chills.  ALLERGIES: No Allergies   MEDICATIONS: Hydrocortisone 2.5 % External Cream External  Vitamin D TABS Oral  Vitamin K TABS Oral     GU PSH: Cysto Uretero Lithotripsy - 2014 Cystoscopy Insert Stent - 2014      PSH Notes: Cystoscopy With Ureteroscopy With Lithotripsy, Cystoscopy With Insertion Of Ureteral Stent Right, No Surgical Problems   NON-GU PSH: None   GU PMH: Calculus Ureter, Left ureteral calculus - 2015 Other microscopic hematuria, Microscopic hematuria - 2015 Personal Hx urinary calculi, History of renal calculi - 2014    NON-GU PMH: Leiomyoma of uterus, unspecified, Fibroid, uterine - 2015 Encounter for general adult medical examination without abnormal findings, Encounter for preventive health examination - 2014 Personal history of other endocrine, nutritional and metabolic disease, History of hypothyroidism - 2014, History of hypercholesterolemia, - 2014    FAMILY HISTORY: Diabetes - Father Family Health Status Number - Runs In Family nephrolithiasis - Father   SOCIAL  HISTORY: Marital Status: Married Current Smoking Status: Patient smokes.  Does not drink anymore.  Drinks 1 caffeinated drink per day.     Notes: Current some day smoker, Tobacco use, Caffeine Use, Alcohol Use, Occupation:, Marital History - Currently Married   REVIEW OF SYSTEMS:    GU Review Female:   Patient reports frequent urination and burning /pain with urination. Patient denies hard to postpone urination, get up at night to urinate, leakage of urine, stream starts and stops, trouble starting your stream, have to strain to urinate, and currently pregnant.  Gastrointestinal (Upper):   Patient reports nausea. Patient denies vomiting and indigestion/ heartburn.  Gastrointestinal (Lower):   Patient denies constipation and diarrhea.  Constitutional:   Patient denies fever, night sweats, weight loss, and fatigue.  Skin:   Patient denies skin rash/ lesion and itching.  Eyes:   Patient denies blurred vision and double vision.  Ears/ Nose/ Throat:   Patient reports sore throat. Patient denies sinus problems.  Hematologic/Lymphatic:   Patient denies swollen glands and easy bruising.  Cardiovascular:   Patient denies leg swelling and chest pains.  Respiratory:   Patient denies cough and shortness of breath.  Endocrine:   Patient denies excessive thirst.  Musculoskeletal:   Patient denies back pain and joint pain.  Neurological:   Patient denies headaches and dizziness.  Psychologic:   Patient denies depression and anxiety.   VITAL SIGNS:      02/15/2016 01:01 PM  Weight 102 lb / 46.27 kg  Height 61 in / 154.94 cm  BP 115/71 mmHg  Pulse 72 /min  BMI 19.3 kg/m  MULTI-SYSTEM PHYSICAL EXAMINATION:    Constitutional: Well-nourished. No physical deformities. Normally developed. Good grooming.  Respiratory: No labored breathing, no use of accessory muscles. CTA-B  Cardiovascular: Normal temperature, normal extremity pulses, no swelling, no varicosities. RRR  Musculoskeletal: Normal gait and  station of head and neck.     PAST DATA REVIEWED:  Source Of History:  Patient  X-Ray Review: C.T. Abdomen/Pelvis: Reviewed Films. Discussed With Patient. The patient has a 6 mm 12 mm stone in the proximal left ureter. She has bilateral nonobstructing sounds which are significantly smaller.    PROCEDURES:         KUB - 74000  A single view of the abdomen is obtained. The renal poorly shadows were visualized - there were no calcifications within the renal shadows. The patient's abdomen is somewhat obscured by significant amount of stool and gas but I do appreciate the patient's stone in the expected location of the left proximal ureter. There are no boney structure abnormalities      The patient's stone is visualized just distal to L2 on the left. It has been marked with an arrow sign.   ASSESSMENT:      ICD-10 Details  1 GU:   Calculus Kidney and Ureter - N20.2 Left, Stable, Acute - The patient has a large stone in the left proximal ureter that is unlikely to pass on its own. I recommended definitive management versus medical expulsion therapy.   PLAN:           Orders Labs Urine Culture and Sensitivity  X-Rays: KUB          Schedule         Document Letter(s):  Created for Patient: Clinical Summary    We discussed management options including medical expulsion therapy, shockwave lithotripsy, and ureteroscopy. Ultimately, the patient has opted for shock wave lithotripsy. I discussed with the patient the procedure in detail as well as the risk and benefits. The patient is aware that she may need additional procedures. She also is aware of the risks of hematoma and pain. We will try to get this patient's scheduled as soon as possible.         Notes:   The patient's stone is visualized just distal to L2 on the left. It has been marked with an arrow sign.   When the patient follows up with me in 3 months, I would like to have a 24-hour urine collection performed prior so that we  can review this with her at that time.

## 2016-02-23 NOTE — Anesthesia Preprocedure Evaluation (Signed)
Anesthesia Evaluation    History of Anesthesia Complications (+) Family history of anesthesia reaction and history of anesthetic complications  Airway Mallampati: II  TM Distance: >3 FB Neck ROM: Full    Dental no notable dental hx.    Pulmonary Current Smoker,    Pulmonary exam normal breath sounds clear to auscultation       Cardiovascular Normal cardiovascular exam Rhythm:Regular Rate:Normal  H/O WPW   Neuro/Psych  Neuromuscular disease    GI/Hepatic   Endo/Other    Renal/GU Renal disease     Musculoskeletal  (+) Arthritis ,   Abdominal   Peds  Hematology   Anesthesia Other Findings   Reproductive/Obstetrics                             Anesthesia Physical Anesthesia Plan  ASA: III  Anesthesia Plan: General   Post-op Pain Management:    Induction: Intravenous  Airway Management Planned: LMA  Additional Equipment:   Intra-op Plan:   Post-operative Plan: Extubation in OR  Informed Consent: I have reviewed the patients History and Physical, chart, labs and discussed the procedure including the risks, benefits and alternatives for the proposed anesthesia with the patient or authorized representative who has indicated his/her understanding and acceptance.   Dental advisory given  Plan Discussed with: CRNA  Anesthesia Plan Comments:         Anesthesia Quick Evaluation

## 2016-02-23 NOTE — Op Note (Signed)
Preoperative diagnosis:  Left proximal ureteral stone   Postoperative diagnosis:  1. same   Procedure: 1. Cystoscopy with retrograde Polygram and interpretation 2. Ureteral balloon dilation 3. Diagnostic ureteroscopy 4. 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   The bifurcation was difficult to localize on imaging.  This was also not clearly delineated by the retrograde pyelogram.  There was a wisp of contrast on the initial retrograde pyelogram at the level of L4 that would suggest that the os was somewhere in this region. The retrograde pyelogram did demonstrate a normal caliber ureter to the level of the UPJ.  At that point only a small aspect of the collecting system was clearly delineated, notably the lower moiety.  There was extensive extravasation almost immediately with very little pressure at the UPJ.  Initially was unable to advance the semi-rigid ureteroscope past the UVJ and as such did have to dilate the ureter both distally and within the mid ureter.  Maybe because of this dilation I was unable to find the ureteral os of the upper pole moiety.   I searched and searched and was unable to find it.  EBL: Minimal  Specimens: None  Indication: Lauren Sanchez is a 60 y.o. patient with large left UPJ stone in the upper moiety of the left kidney.  This is clearly a change, compared to her CT scan in 2015.   After reviewing the management options for treatment, she elected to proceed with the above surgical procedure(s). We have discussed the potential benefits and risks of the procedure, side effects of the proposed treatment, the likelihood of the patient achieving the goals of the procedure, and any potential problems that might occur during the procedure or recuperation. Informed consent has been obtained.  Description of  procedure:  The patient was taken to the operating room and general anesthesia was induced.  The patient was placed in the dorsal lithotomy position, prepped and draped in the usual sterile fashion, and preoperative antibiotics were administered. A preoperative time-out was performed.   A 21 French 30 cystoscope was gently passed through the patient's urethra and into the bladder.  360 cystoscopic evaluation was performed noting her ureteral orifice in orthotopic position bilaterally.  There were no additional left sided ureteral orifice noted.  I then cannulated the patient's left ureteral orifice and performed a retrograde pyelogram with the above findings.  I then advanced safety wire through the open-ended catheter and into the lower pole of the moiety.  I then attempted to advance a 6/4 short semirigid ureteroscope into the left ureter.  I was unable to advance beyond the intramural ureter without significant pressure.  As such, I opted to remove the scope and passed a second wire through a dual lumen catheter and into the left lower pole.  I then dilated the UVJ intramural and distal ureter with a 10 cm 15 French balloon dilator.  I then again attempted to advance a 12/14 [place] ureteral access sheath and was unable to get this beyond the mid ureter.  As such, I removed the access sheath and again advanced the 10 cm 15 French ureteral balloon dilator to this region and dilated the mid ureter to the proximal ureter.At this point I was able to advance the 12/14 [place] ureteral access sheath into the proximal ureter.  Ureteroscopy was now performed using a flexible ureteroscope.  The lower moiety was inspected and no stones encountered.  I then backed out the ureteroscope as well as the access sheath to the mid ureter looking for the bifurcation to the upper moiety unsuccessfully.  I then removed the access sheath entirely and advanced the ureteroscope into the ureter on its own.  I performed ureteroscopy  along the entire course and searched and searched for the patient's upper pole ureteral os unsuccessfully.  Ultimately, a decision was made to just place a stent and wake the patient up.  I then advanced a 24 cm 6 French double-J stent over the wire and advanced it into the left lower pole under fluoroscopic guidance.  Once the stent was noted to be well-positioned I backed the wire out to ensure that a curl was obtained in the lower pole.  I then deployed the remaining stent within the patient's bladder by advancing the stent into the ureteral orifice and then removing the wire.  The bladder was then emptied.  The stent was noted to be well-positioned under Fluoroscopic imaging.  The patient was subsequently awoken and taken to the PACU in stable condition.  Ardis Hughs, M.D.

## 2016-02-23 NOTE — Progress Notes (Signed)
Patient teary -eyed and crying at intervals throughout PACU stay - upset about condition and wanting to make trip to see her father in [place].

## 2016-02-23 NOTE — Interval H&P Note (Signed)
History and Physical Interval Note:  02/23/2016 10:11 AM  Lauren Sanchez  has presented today for surgery, with the diagnosis of left ureteral stone  The various methods of treatment have been discussed with the patient and family. After consideration of risks, benefits and other options for treatment, the patient has consented to  Procedure(s): CYSTOSCOPY WITH LEFT RETROGRADE PYELOGRAM, LEFT URETEROSCOPY, LASER LITHOTRIPSY  AND LEFT STENT PLACEMENT (Left) HOLMIUM LASER APPLICATION (N/A) as a surgical intervention .  The patient's history has been reviewed, patient examined, no change in status, stable for surgery.  I have reviewed the patient's chart and labs.  Questions were answered to the patient's satisfaction.     Louis Meckel W

## 2016-02-23 NOTE — Transfer of Care (Signed)
Immediate Anesthesia Transfer of Care Note  Patient: Lauren Sanchez  Procedure(s) Performed: Procedure(s): CYSTOSCOPY WITH LEFT RETROGRADE PYELOGRAM, LEFT URETEROSCOPY  AND LEFT STENT PLACEMENT (Left) HOLMIUM LASER APPLICATION (N/A)  Patient Location: PACU  Anesthesia Type:General  Level of Consciousness:  sedated, patient cooperative and responds to stimulation  Airway & Oxygen Therapy:Patient Spontanous Breathing and Patient connected to face mask oxgen  Post-op Assessment:  Report given to PACU RN and Post -op Vital signs reviewed and stable  Post vital signs:  Reviewed and stable  Last Vitals:  Vitals:   02/23/16 0801  BP: 117/76  Pulse: 74  Resp: 16  Temp: 123XX123 C    Complications: No apparent anesthesia complications

## 2016-02-23 NOTE — Progress Notes (Signed)
Patient calmer- less teary -eyed- wanating to see her husband

## 2016-02-23 NOTE — Anesthesia Postprocedure Evaluation (Signed)
Anesthesia Post Note  Patient: Journeigh Cannada  Procedure(s) Performed: Procedure(s) (LRB): CYSTOSCOPY WITH LEFT RETROGRADE PYELOGRAM, LEFT URETEROSCOPY  AND LEFT STENT PLACEMENT (Left) HOLMIUM LASER APPLICATION (N/A)  Patient location during evaluation: PACU Anesthesia Type: General Level of consciousness: awake and alert Pain management: pain level controlled Vital Signs Assessment: post-procedure vital signs reviewed and stable Respiratory status: spontaneous breathing, nonlabored ventilation, respiratory function stable and patient connected to nasal cannula oxygen Cardiovascular status: blood pressure returned to baseline and stable Postop Assessment: no signs of nausea or vomiting Anesthetic complications: no    Last Vitals:  Vitals:   02/23/16 1330 02/23/16 1344  BP: (!) 131/94 127/87  Pulse: 84 72  Resp: (!) 22 20  Temp: 36.6 C 36.5 C    Last Pain:  Vitals:   02/23/16 1403  TempSrc:   PainSc: 4                  Hoke Baer J

## 2016-02-23 NOTE — Progress Notes (Signed)
Dr. Louis Meckel talked with patient- patient crying- upset kidney stone  Could not be found- made aware that doctor had talked with her husband- and that they will meet next week and talk about options

## 2016-02-23 NOTE — Progress Notes (Signed)
Patient arrives to Short Stay from PACU very tearful as surgery did not go as she expected. Dr Louis Meckel was not able to find kidney stone and she will be referred to a urologist at another hospital. Emotional support given. Patient medicated for discomfort. Husband at her bedside.

## 2016-02-23 NOTE — Discharge Instructions (Signed)
General Anesthesia, Adult, Care After Refer to this sheet in the next few weeks. These instructions provide you with information on caring for yourself after your procedure. Your health care provider may also give you more specific instructions. Your treatment has been planned according to current medical practices, but problems sometimes occur. Call your health care provider if you have any problems or questions after your procedure. WHAT TO EXPECT AFTER THE PROCEDURE After the procedure, it is typical to experience:  Sleepiness.  Nausea and vomiting. HOME CARE INSTRUCTIONS  For the first 24 hours after general anesthesia:  Have a responsible person with you.  Do not drive a car. If you are alone, do not take public transportation.  Do not drink alcohol.  Do not take medicine that has not been prescribed by your health care provider.  Do not sign important papers or make important decisions.  You may resume a normal diet and activities as directed by your health care provider.  Change bandages (dressings) as directed.  If you have questions or problems that seem related to general anesthesia, call the hospital and ask for the anesthetist or anesthesiologist on call. SEEK MEDICAL CARE IF:  You have nausea and vomiting that continue the day after anesthesia.  You develop a rash. SEEK IMMEDIATE MEDICAL CARE IF:   You have difficulty breathing.  You have chest pain.  You have any allergic problems.   This information is not intended to replace advice given to you by your health care provider. Make sure you discuss any questions you have with your health care provider.   Document Released: 08/26/2000 Document Revised: 06/10/2014 Document Reviewed: 09/18/2011 Elsevier Interactive Patient Education 2016 [facility] INSTRUCTIONS FOR KIDNEY STONE/URETERAL STENT   MEDICATIONS:  1.  Resume all your other meds from home - except do not take any extra  narcotic pain meds that you may have at home.  2. Pyridium is to help with the burning/stinging when you urinate. 3. Tramadol is for moderate/severe pain, otherwise taking upto 1000 mg every 6 hours of plainTylenol will help treat your pain.      ACTIVITY:  1. No strenuous activity x 1week  2. No driving while on narcotic pain medications  3. Drink plenty of water  4. Continue to walk at home - you can still get blood clots when you are at home, so keep active, but don't over do it.  5. May return to work/school tomorrow or when you feel ready   BATHING:  1. You can shower and we recommend daily showers    SIGNS/SYMPTOMS TO CALL:  Please call us if you have a fever greater than 101.5, uncontrolled nausea/vomiting, uncontrolled pain, dizziness, unable to urinate, bloody urine, chest pain, shortness of breath, leg swelling, leg pain, redness around wound, drainage from wound, or any other concerns or questions.   You can reach [place] at [phone].   FOLLOW-UP:  1. We will contact you on Monday for an appointment next week.

## 2016-12-17 ENCOUNTER — Other Ambulatory Visit: Payer: Self-pay | Admitting: Obstetrics and Gynecology

## 2016-12-17 DIAGNOSIS — Z1231 Encounter for screening mammogram for malignant neoplasm of breast: Secondary | ICD-10-CM

## 2016-12-25 ENCOUNTER — Ambulatory Visit: Payer: BLUE CROSS/BLUE SHIELD

## 2016-12-31 ENCOUNTER — Ambulatory Visit
Admission: RE | Admit: 2016-12-31 | Discharge: 2016-12-31 | Disposition: A | Discharge status: Home / Self Care | Payer: BLUE CROSS/BLUE SHIELD | Source: Ambulatory Visit | Attending: Obstetrics and Gynecology | Admitting: Obstetrics and Gynecology

## 2016-12-31 DIAGNOSIS — Z1231 Encounter for screening mammogram for malignant neoplasm of breast: Secondary | ICD-10-CM

## 2017-07-28 IMAGING — RF DG ABDOMEN 1V
1 series · 15 of 18 positions shown · non-contrast
Comparison: CT abdomen and pelvis 02/11/2016.

CLINICAL DATA: Retrograde pyelogram and double-J ureteral stent
placement for place shin with a left ureteral stone.

EXAM:
ABDOMEN - 1 VIEW; DG C-ARM GT 120 MIN-NO REPORT

[Series 1: run · 15 of 18 slices shown]
[im 1/18]
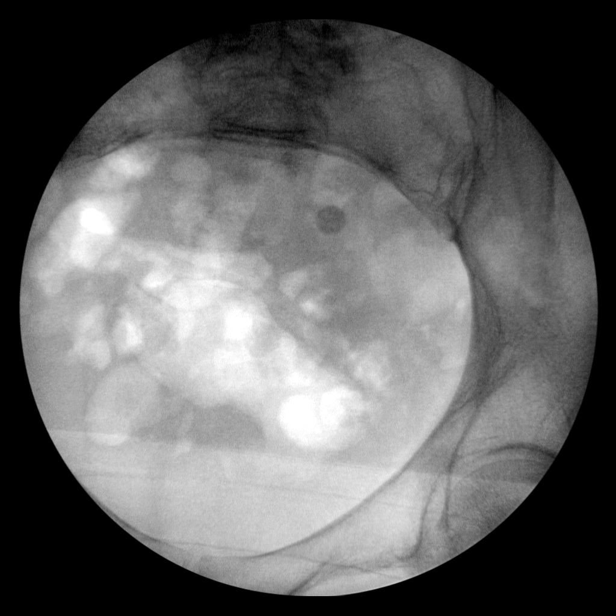
[im 2/18]
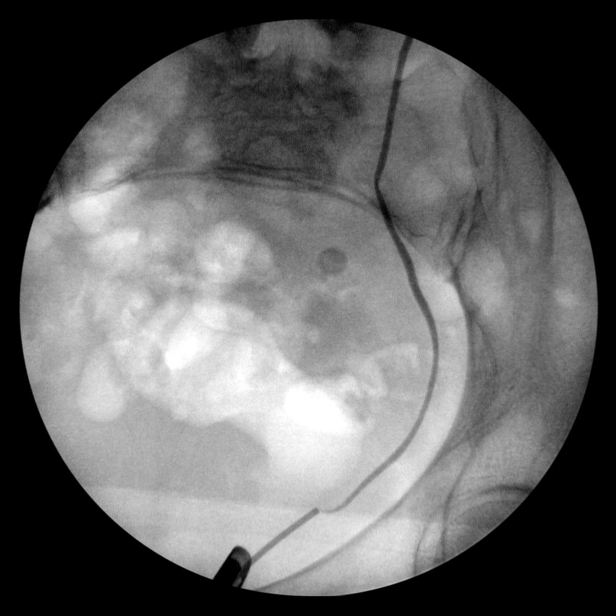
[im 4/18]
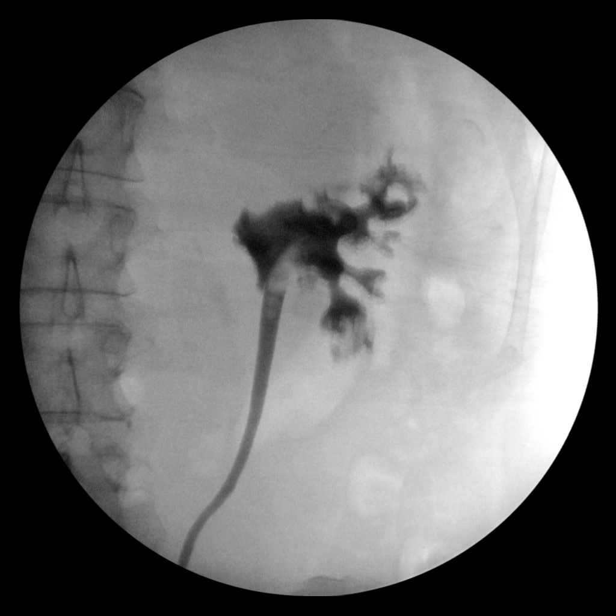
[im 5/18]
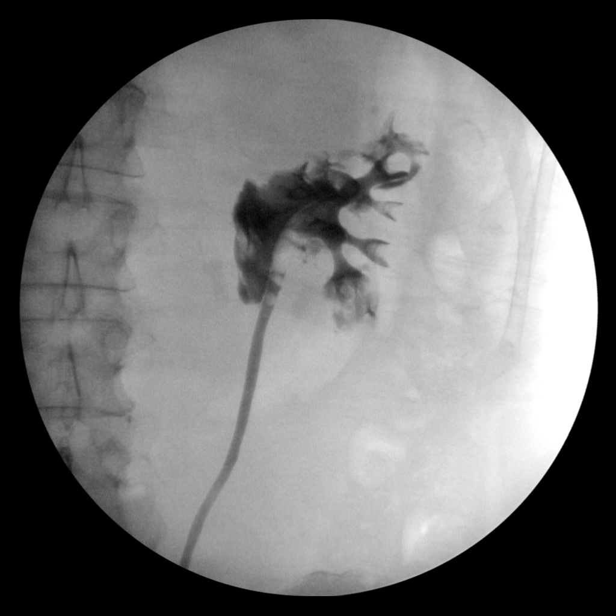
[im 6/18]
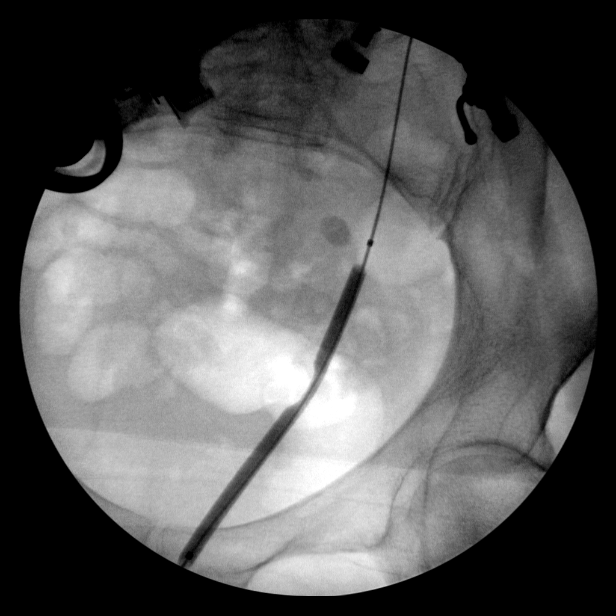
[im 7/18]
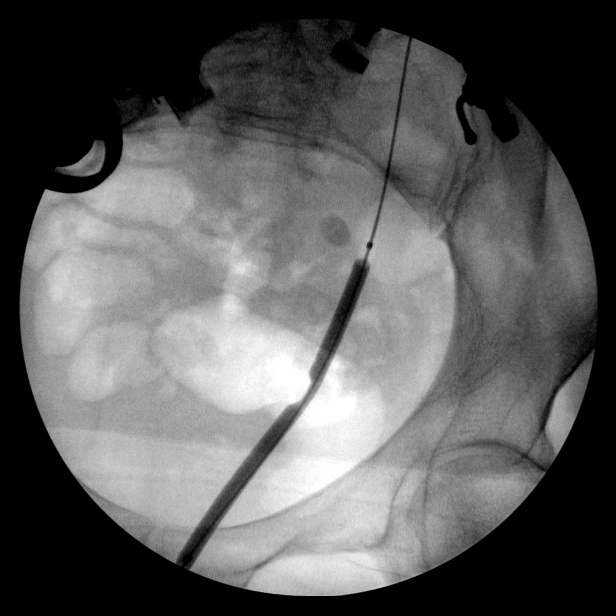
[im 8/18]
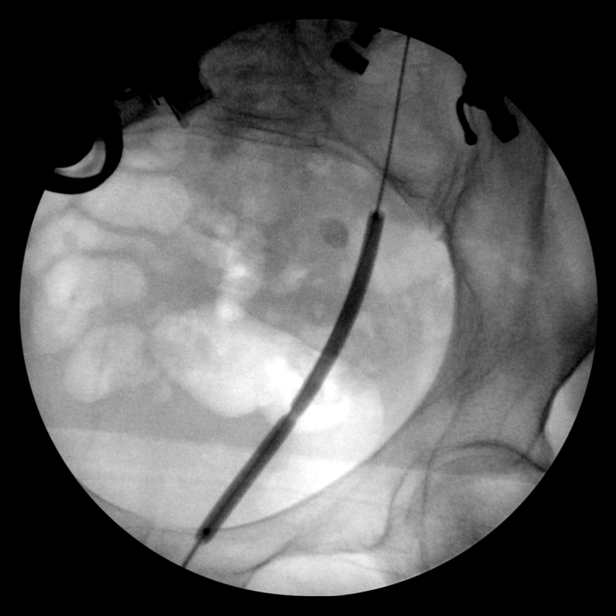
[im 10/18]
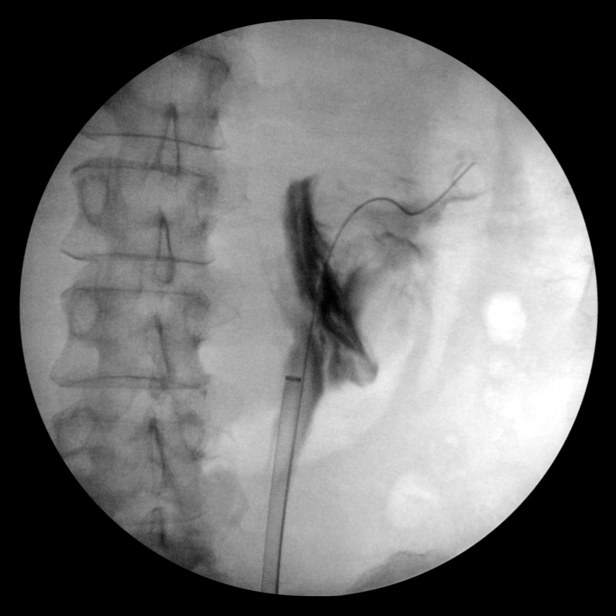
[im 11/18]
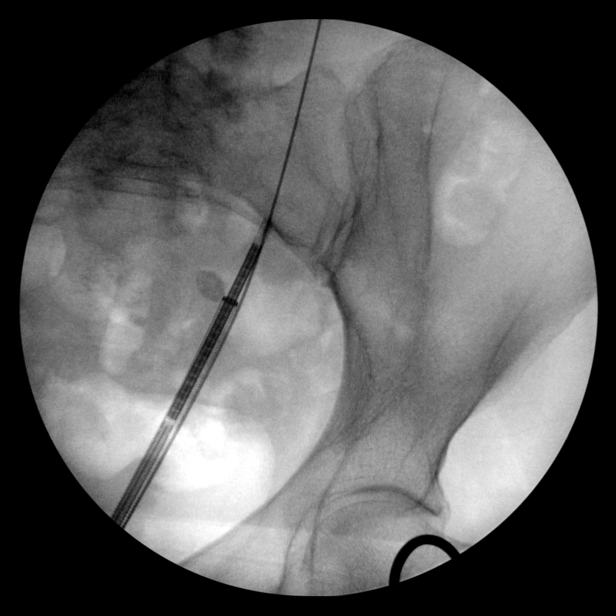
[im 12/18]
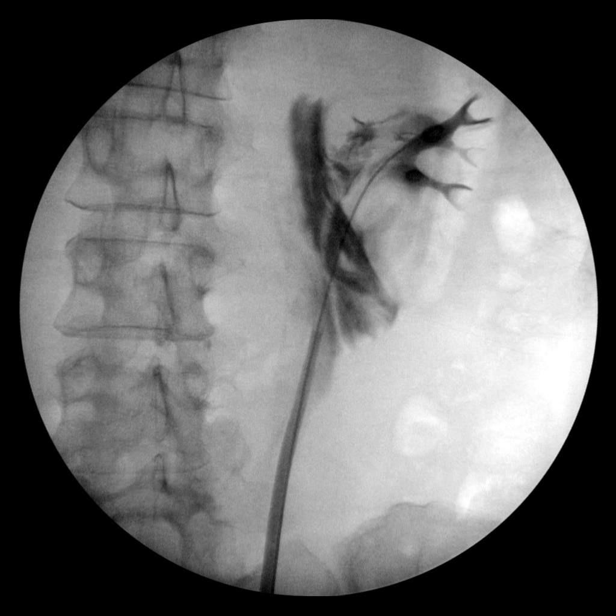
[im 13/18]
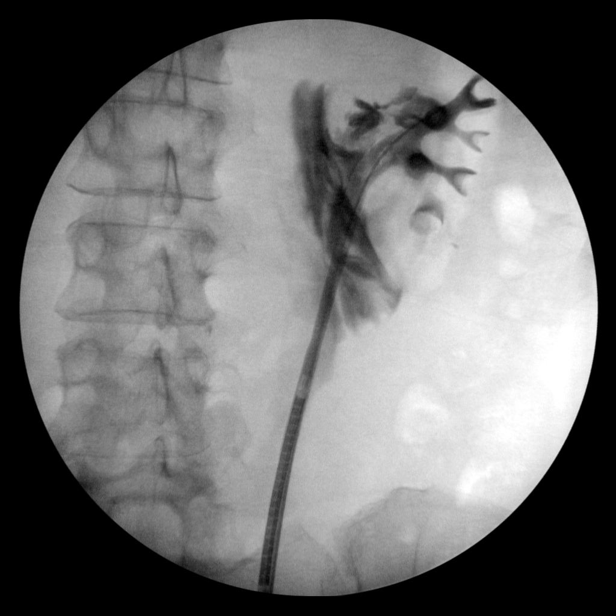
[im 14/18]
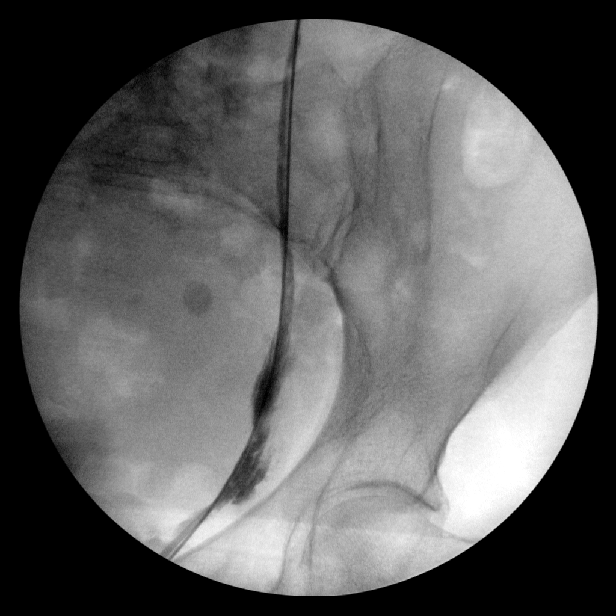
[im 16/18]
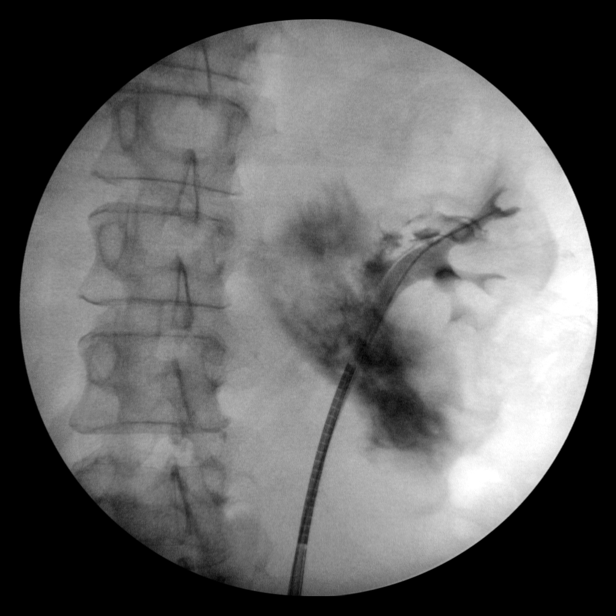
[im 17/18]
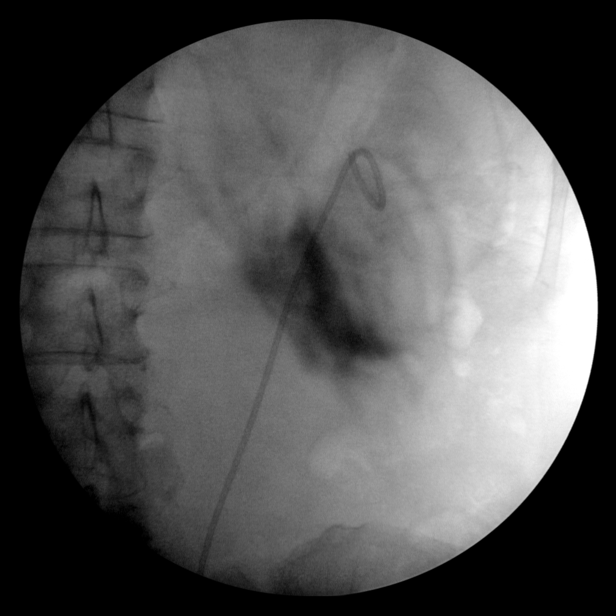
[im 18/18]
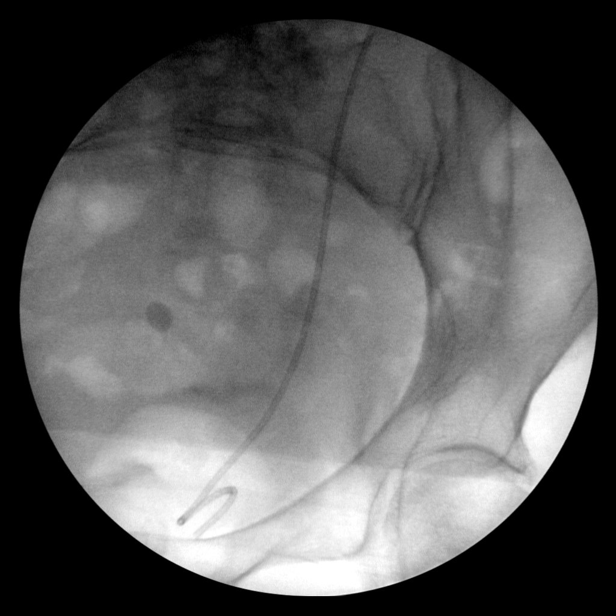

[15 of 18 positions shown; findings below may reference images not displayed]

FINDINGS: Series of fluoroscopic spot views of the abdomen are provided.
Images demonstrate left retrograde pyelogram. Filling defect in the
proximal left ureter correlates with the patient's known stone.
There also appear to be air bubbles in the left ureter. Contrast
appears to extravasate from the left renal pelvis. Final 2 images
demonstrate a double-J ureteral stent projecting good position.
IMPRESSION: Lithotripsy and left double-J ureteral stent placement. There
appears to be extravasation of contrast from the proximal left
ureter or renal pelvis.

## 2018-11-12 ENCOUNTER — Other Ambulatory Visit: Payer: Self-pay | Admitting: General Practice

## 2018-11-12 DIAGNOSIS — Z1231 Encounter for screening mammogram for malignant neoplasm of breast: Secondary | ICD-10-CM

## 2018-12-29 ENCOUNTER — Ambulatory Visit
Admission: RE | Admit: 2018-12-29 | Discharge: 2018-12-29 | Disposition: A | Discharge status: Home / Self Care | Payer: BC Managed Care – PPO | Source: Ambulatory Visit | Attending: General Practice | Admitting: General Practice

## 2018-12-29 ENCOUNTER — Other Ambulatory Visit: Payer: Self-pay

## 2018-12-29 DIAGNOSIS — Z1231 Encounter for screening mammogram for malignant neoplasm of breast: Secondary | ICD-10-CM

## 2020-03-06 ENCOUNTER — Other Ambulatory Visit: Payer: Self-pay | Admitting: General Practice

## 2020-03-06 DIAGNOSIS — Z1231 Encounter for screening mammogram for malignant neoplasm of breast: Secondary | ICD-10-CM

## 2020-03-09 ENCOUNTER — Other Ambulatory Visit: Payer: Self-pay | Admitting: Urology

## 2020-03-14 ENCOUNTER — Encounter: Payer: Self-pay | Admitting: Gastroenterology

## 2020-03-16 ENCOUNTER — Encounter (HOSPITAL_BASED_OUTPATIENT_CLINIC_OR_DEPARTMENT_OTHER): Payer: Self-pay | Admitting: Urology

## 2020-03-16 ENCOUNTER — Other Ambulatory Visit: Payer: Self-pay

## 2020-03-16 NOTE — Progress Notes (Signed)
Spoke w/ via phone for pre-op interview---Lauren Sanchez [place]----     None           Lab results---none   COVID test ------1018 at 1350 Arrive at ------0530 NPO after MN NO Solid Food.  Clear liquids from MN until--0430- Medications to take morning of surgery -----none  Diabetic medication ----- Patient Special Instructions ----- Pre-Op special Istructions ----- Patient verbalized understanding of instructions that were given at this phone interview. Patient denies shortness of breath, chest pain, fever, cough at this phone interview. Patient would like to keep glasses on untli last possible moment since she is very nearsighted per pt

## 2020-03-20 ENCOUNTER — Other Ambulatory Visit (HOSPITAL_COMMUNITY)
Admission: RE | Admit: 2020-03-20 | Discharge: 2020-03-20 | Disposition: A | Discharge status: Home / Self Care | Payer: BC Managed Care – PPO | Source: Ambulatory Visit | Attending: Urology | Admitting: Urology

## 2020-03-20 DIAGNOSIS — Z20822 Contact with and (suspected) exposure to covid-19: Secondary | ICD-10-CM | POA: Diagnosis not present

## 2020-03-20 DIAGNOSIS — Z01812 Encounter for preprocedural laboratory examination: Secondary | ICD-10-CM | POA: Diagnosis not present

## 2020-03-20 LAB — SARS CORONAVIRUS 2 (TAT 6-24 HRS): SARS Coronavirus 2: NEGATIVE

## 2020-03-23 ENCOUNTER — Encounter (HOSPITAL_BASED_OUTPATIENT_CLINIC_OR_DEPARTMENT_OTHER)
Admission: RE | Disposition: A | Discharge status: Home / Self Care | Payer: Self-pay | Source: Home / Self Care | Attending: Urology

## 2020-03-23 ENCOUNTER — Ambulatory Visit (HOSPITAL_BASED_OUTPATIENT_CLINIC_OR_DEPARTMENT_OTHER)
Admission: RE | Admit: 2020-03-23 | Discharge: 2020-03-23 | Disposition: A | Discharge status: Home / Self Care | Payer: BC Managed Care – PPO | Attending: Urology | Admitting: Urology

## 2020-03-23 ENCOUNTER — Ambulatory Visit (HOSPITAL_BASED_OUTPATIENT_CLINIC_OR_DEPARTMENT_OTHER): Payer: BC Managed Care – PPO | Admitting: Anesthesiology

## 2020-03-23 ENCOUNTER — Other Ambulatory Visit: Payer: Self-pay

## 2020-03-23 ENCOUNTER — Encounter (HOSPITAL_BASED_OUTPATIENT_CLINIC_OR_DEPARTMENT_OTHER): Payer: Self-pay | Admitting: Urology

## 2020-03-23 DIAGNOSIS — Z87442 Personal history of urinary calculi: Secondary | ICD-10-CM | POA: Diagnosis not present

## 2020-03-23 DIAGNOSIS — F17200 Nicotine dependence, unspecified, uncomplicated: Secondary | ICD-10-CM | POA: Insufficient documentation

## 2020-03-23 DIAGNOSIS — N201 Calculus of ureter: Secondary | ICD-10-CM | POA: Insufficient documentation

## 2020-03-23 HISTORY — PX: CYSTOSCOPY WITH RETROGRADE PYELOGRAM, URETEROSCOPY AND STENT PLACEMENT: SHX5789

## 2020-03-23 SURGERY — CYSTOURETEROSCOPY, WITH RETROGRADE PYELOGRAM AND STENT INSERTION
Anesthesia: General | Laterality: Left

## 2020-03-23 MED ORDER — BELLADONNA ALKALOIDS-OPIUM 16.2-60 MG RE SUPP
RECTAL | Status: DC | PRN
  Administered 2020-03-23: 1 via RECTAL

## 2020-03-23 MED ORDER — PROPOFOL 10 MG/ML IV BOLUS
INTRAVENOUS | Status: AC
  Filled 2020-03-23: qty 40

## 2020-03-23 MED ORDER — CEFAZOLIN SODIUM-DEXTROSE 2-4 GM/100ML-% IV SOLN
INTRAVENOUS | Status: AC
  Filled 2020-03-23: qty 100

## 2020-03-23 MED ORDER — PHENYLEPHRINE 40 MCG/ML (10ML) SYRINGE FOR IV PUSH (FOR BLOOD PRESSURE SUPPORT)
PREFILLED_SYRINGE | INTRAVENOUS | Status: DC | PRN
  Administered 2020-03-23 (×5): 80 ug via INTRAVENOUS

## 2020-03-23 MED ORDER — ACETAMINOPHEN 10 MG/ML IV SOLN: 1000.0000 mg | Freq: Once | INTRAVENOUS | Status: DC | PRN

## 2020-03-23 MED ORDER — DEXAMETHASONE SODIUM PHOSPHATE 10 MG/ML IJ SOLN
INTRAMUSCULAR | Status: AC
  Filled 2020-03-23: qty 1

## 2020-03-23 MED ORDER — CEFAZOLIN SODIUM-DEXTROSE 2-4 GM/100ML-% IV SOLN
2.0000 g | INTRAVENOUS | Status: AC
  Administered 2020-03-23: 2 g via INTRAVENOUS

## 2020-03-23 MED ORDER — LIDOCAINE 2% (20 MG/ML) 5 ML SYRINGE
INTRAMUSCULAR | Status: DC | PRN
  Administered 2020-03-23: 100 mg via INTRAVENOUS

## 2020-03-23 MED ORDER — FENTANYL CITRATE (PF) 100 MCG/2ML IJ SOLN
INTRAMUSCULAR | Status: DC | PRN
  Administered 2020-03-23: 50 ug via INTRAVENOUS

## 2020-03-23 MED ORDER — BELLADONNA ALKALOIDS-OPIUM 16.2-60 MG RE SUPP
RECTAL | Status: AC
  Filled 2020-03-23: qty 1

## 2020-03-23 MED ORDER — KETOROLAC TROMETHAMINE 30 MG/ML IJ SOLN
INTRAMUSCULAR | Status: AC
  Filled 2020-03-23: qty 1

## 2020-03-23 MED ORDER — ONDANSETRON HCL 4 MG/2ML IJ SOLN
INTRAMUSCULAR | Status: DC | PRN
  Administered 2020-03-23: 4 mg via INTRAVENOUS

## 2020-03-23 MED ORDER — LACTATED RINGERS IV SOLN: INTRAVENOUS | Status: DC

## 2020-03-23 MED ORDER — CIPROFLOXACIN HCL 500 MG PO TABS: 500.0000 mg | ORAL_TABLET | Freq: Once | ORAL | 0 refills | Status: AC

## 2020-03-23 MED ORDER — IOHEXOL 300 MG/ML  SOLN
INTRAMUSCULAR | Status: DC | PRN
  Administered 2020-03-23: 16 mL via URETHRAL

## 2020-03-23 MED ORDER — KETOROLAC TROMETHAMINE 15 MG/ML IJ SOLN
INTRAMUSCULAR | Status: DC | PRN
  Administered 2020-03-23: 15 mg via INTRAVENOUS

## 2020-03-23 MED ORDER — FENTANYL CITRATE (PF) 100 MCG/2ML IJ SOLN: 25.0000 ug | INTRAMUSCULAR | Status: DC | PRN

## 2020-03-23 MED ORDER — SODIUM CHLORIDE 0.9 % IR SOLN
Status: DC | PRN
  Administered 2020-03-23: 1500 mL

## 2020-03-23 MED ORDER — PROPOFOL 10 MG/ML IV BOLUS
INTRAVENOUS | Status: DC | PRN
  Administered 2020-03-23: 120 mg via INTRAVENOUS

## 2020-03-23 MED ORDER — PHENYLEPHRINE 40 MCG/ML (10ML) SYRINGE FOR IV PUSH (FOR BLOOD PRESSURE SUPPORT)
PREFILLED_SYRINGE | INTRAVENOUS | Status: AC
  Filled 2020-03-23: qty 10

## 2020-03-23 MED ORDER — MIDAZOLAM HCL 2 MG/2ML IJ SOLN
INTRAMUSCULAR | Status: DC | PRN
  Administered 2020-03-23: 1 mg via INTRAVENOUS

## 2020-03-23 MED ORDER — ARTIFICIAL TEARS OPHTHALMIC OINT
TOPICAL_OINTMENT | OPHTHALMIC | Status: AC
  Filled 2020-03-23: qty 3.5

## 2020-03-23 MED ORDER — ONDANSETRON HCL 4 MG/2ML IJ SOLN
INTRAMUSCULAR | Status: AC
  Filled 2020-03-23: qty 2

## 2020-03-23 MED ORDER — 0.9 % SODIUM CHLORIDE (POUR BTL) OPTIME
TOPICAL | Status: DC | PRN
  Administered 2020-03-23: 500 mL

## 2020-03-23 MED ORDER — LIDOCAINE 2% (20 MG/ML) 5 ML SYRINGE
INTRAMUSCULAR | Status: AC
  Filled 2020-03-23: qty 5

## 2020-03-23 MED ORDER — DEXAMETHASONE SODIUM PHOSPHATE 10 MG/ML IJ SOLN
INTRAMUSCULAR | Status: DC | PRN
  Administered 2020-03-23: 8 mg via INTRAVENOUS

## 2020-03-23 MED ORDER — FENTANYL CITRATE (PF) 100 MCG/2ML IJ SOLN
INTRAMUSCULAR | Status: AC
  Filled 2020-03-23: qty 2

## 2020-03-23 MED ORDER — MIDAZOLAM HCL 2 MG/2ML IJ SOLN
INTRAMUSCULAR | Status: AC
  Filled 2020-03-23: qty 2

## 2020-03-23 SURGICAL SUPPLY — 22 items
BAG DRAIN URO-CYSTO SKYTR STRL (DRAIN) ×3 IMPLANT
BAG DRN UROCATH (DRAIN) ×1
BASKET LASER NITINOL 1.9FR (BASKET) IMPLANT
BASKET STONE 1.7 NGAGE (UROLOGICAL SUPPLIES) ×3 IMPLANT
BSKT STON RTRVL 120 1.9FR (BASKET)
CATH URET 5FR 28IN OPEN ENDED (CATHETERS) ×3 IMPLANT
CATH URET DUAL LUMEN 6-10FR 50 (CATHETERS) IMPLANT
CLOTH BEACON ORANGE TIMEOUT ST (SAFETY) ×3 IMPLANT
EXTRACTOR STONE 1.7FRX115CM (UROLOGICAL SUPPLIES) IMPLANT
FIBER LASER FLEXIVA 365 (UROLOGICAL SUPPLIES) ×3 IMPLANT
FIBER LASER TRAC TIP (UROLOGICAL SUPPLIES) IMPLANT
GLOVE BIO SURGEON STRL SZ7.5 (GLOVE) ×3 IMPLANT
GOWN STRL REUS W/TWL XL LVL3 (GOWN DISPOSABLE) ×3 IMPLANT
GUIDEWIRE STR DUAL SENSOR (WIRE) ×3 IMPLANT
IV NS IRRIG 3000ML ARTHROMATIC (IV SOLUTION) ×3 IMPLANT
KIT TURNOVER CYSTO (KITS) ×3 IMPLANT
MANIFOLD NEPTUNE II (INSTRUMENTS) ×3 IMPLANT
NS IRRIG 500ML POUR BTL (IV SOLUTION) ×3 IMPLANT
PACK CYSTO (CUSTOM PROCEDURE TRAY) ×3 IMPLANT
TUBE CONNECTING 12'X1/4 (SUCTIONS)
TUBE CONNECTING 12X1/4 (SUCTIONS) IMPLANT
TUBING UROLOGY SET (TUBING) ×3 IMPLANT

## 2020-03-23 NOTE — Transfer of Care (Signed)
  Last Vitals:  Vitals Value Taken Time  BP 101/68 03/23/20 0832  Temp 36.7 C 03/23/20 0832  Pulse 60 03/23/20 0834  Resp 12 03/23/20 0834  SpO2 100 % 03/23/20 0834  Vitals shown include unvalidated device data.  Last Pain:  Vitals:   03/23/20 0604  TempSrc: Oral  PainSc: 1       Patients Stated Pain Goal: 5 (03/23/20 0604)  Immediate Anesthesia Transfer of Care Note  Patient: Lauren Sanchez  Procedure(s) Performed: Procedure(s) (LRB): CYSTOSCOPY WITH RETROGRADE PYELOGRAM, LEFT URETEROSCOPY HOLMIUM LASER AND STENT PLACEMENT (Left)  Patient Location: PACU  Anesthesia Type: General  Level of Consciousness: awake, alert  and oriented  Airway & Oxygen Therapy: Patient Spontanous Breathing and Patient connected to nasal cannula oxygen  Post-op Assessment: Report given to PACU RN and Post -op Vital signs reviewed and stable  Post vital signs: Reviewed and stable  Complications: No apparent anesthesia complications

## 2020-03-23 NOTE — Op Note (Signed)
Preoperative diagnosis: left ureteral calculus  Postoperative diagnosis: left ureteral calculus  Procedure:  1. Cystoscopy 2. left ureteroscopy and stone removal 3. Ureteroscopic laser lithotripsy 4. Left 2F x 22cm polaris ureteral stent placement  5. left retrograde pyelography with interpretation  Surgeon: Ardis Hughs, MD  Anesthesia: General  Complications: None  Intraoperative findings: left retrograde pyelography demonstrated a filling defect within the left ureter consistent with the patient's known calculus without other abnormalities.  Left lower pole moiety filled, no branch or identifiable bifurcation of ureter.  EBL: Minimal  Specimens: 1. left ureteral calculus  Disposition of specimens: [facility] for stone analysis  Indication: Lauren Sanchez is a 64 y.o.   patient with a left ureteral stone and associated left symptoms. After reviewing the management options for treatment, the patient elected to proceed with the above surgical procedure(s). We have discussed the potential benefits and risks of the procedure, side effects of the proposed treatment, the likelihood of the patient achieving the goals of the procedure, and any potential problems that might occur during the procedure or recuperation. Informed consent has been obtained.   Description of procedure:  The patient was taken to the operating room and general anesthesia was induced.  The patient was placed in the dorsal lithotomy position, prepped and draped in the usual sterile fashion, and preoperative antibiotics were administered. A preoperative time-out was performed.   Cystourethroscopy was performed.  The patient's urethra was examined and was normal. The bladder was then systematically examined in its entirety. There was no evidence for any bladder tumors, stones, or other mucosal pathology.    Attention then turned to the left ureteral orifice and a ureteral catheter was used to  intubate the ureteral orifice.  Omnipaque contrast was injected through the ureteral catheter and a retrograde pyelogram was performed with findings as dictated above.  A 0.38 sensor guidewire was then advanced up the left ureter into the renal pelvis under fluoroscopic guidance. The 6 Fr semirigid ureteroscope was then advanced into the ureter next to the guidewire and the calculus was identified.   The stone was then fragmented with the 365 micron holmium laser fiber on a setting of 0.6 and frequency of 6 Hz.   All stones were then removed from the ureter with an N-gage nitinol basket.  Reinspection of the ureter revealed no remaining visible stones or fragments.   The wire was then backloaded through the cystoscope and a ureteral stent was advance over the wire using Seldinger technique.  The stent was positioned appropriately under fluoroscopic and cystoscopic guidance.  The wire was then removed with an adequate stent curl noted in the renal pelvis as well as in the bladder.  The bladder was then emptied and the procedure ended.  The patient appeared to tolerate the procedure well and without complications.  The patient was able to be awakened and transferred to the recovery unit in satisfactory condition.   Disposition: The tether of the stent was left on and  tucked inside the patient's vagina.  Instructions for removing the stent have been provided to the patient. The patient has been scheduled for followup in 6 weeks with a renal ultrasound.

## 2020-03-23 NOTE — Anesthesia Postprocedure Evaluation (Signed)
Anesthesia Post Note  Patient: Lauren Sanchez Name  Procedure(s) Performed: CYSTOSCOPY WITH RETROGRADE PYELOGRAM, LEFT URETEROSCOPY HOLMIUM LASER AND STENT PLACEMENT (Left )     Patient location during evaluation: PACU Anesthesia Type: General Level of consciousness: awake and alert Pain management: pain level controlled Vital Signs Assessment: post-procedure vital signs reviewed and stable Respiratory status: spontaneous breathing, nonlabored ventilation, respiratory function stable and patient connected to nasal cannula oxygen Cardiovascular status: blood pressure returned to baseline and stable Postop Assessment: no apparent nausea or vomiting Anesthetic complications: no   No complications documented.  Last Vitals:  Vitals:   03/23/20 0900 03/23/20 0915  BP: 108/68 112/70  Pulse: (!) 55 (!) 57  Resp: 16 12  Temp:    SpO2: 99% 100%    Last Pain:  Vitals:   03/23/20 0900  TempSrc:   PainSc: 0-No pain                 Alfreida Steffenhagen S

## 2020-03-23 NOTE — Interval H&P Note (Signed)
History and Physical Interval Note:  03/23/2020 7:32 AM  Lauren Sanchez  has presented today for surgery, with the diagnosis of ;EFT DISTAL URETERAL STONE.  The various methods of treatment have been discussed with the patient and family. After consideration of risks, benefits and other options for treatment, the patient has consented to  Procedure(s): CYSTOSCOPY WITH RETROGRADE PYELOGRAM, LEFT URETEROSCOPY HOLMIUM LASER AND STENT PLACEMENT (Left) as a surgical intervention.  The patient's history has been reviewed, patient examined, no change in status, stable for surgery.  I have reviewed the patient's chart and labs.  Questions were answered to the patient's satisfaction.     Ardis Hughs

## 2020-03-23 NOTE — H&P (Signed)
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 She denies unilateral flank pain, passage of stone material, fevers, chills, vomiting. She reports that she does not feel well and she has some increased fatigue. She she was seen by her primary care physician who sent her over for further evaluation to to negative urine culture and microscopic hematuria.   Today, she reports that she is not feeling 100% however she denies fevers and chills. She does endorse fatigue as well as some pelvic discomfort and increased urinary frequency. Her urine does have trace amount of blood.     ALLERGIES: No Allergies    MEDICATIONS: Fish Oil  Vitamin D3 50 mcg (2,000 unit) tablet Oral     GU PSH: Cysto Remove Stent FB Sim - 2017 Cysto Uretero Lithotripsy - 2014 Cystoscopy Insert Stent, Left - 2017, 2014 Cystoscopy Ureteroscopy, Left - 2017       PSH Notes: Cystoscopy With Ureteroscopy With Lithotripsy, Cystoscopy With Insertion Of Ureteral Stent Right, No Surgical Problems   NON-GU PSH: None   GU PMH: Gross hematuria - 2019 Renal and ureteral calculus - 2017, (Stable, Acute), Left, The patient has a large stone in the left proximal ureter that is unlikely to pass on its own. I recommended definitive management versus medical expulsion therapy., - 2017 Other microscopic hematuria, Microscopic hematuria - 2015 Ureteral calculus, Left ureteral calculus - 2015 History of urolithiasis, History of renal calculi - 2014    NON-GU PMH: Leiomyoma of uterus, unspecified, Fibroid, uterine - 2015 Encounter for general adult medical examination without abnormal findings, Encounter for preventive health examination - 2014 Personal history of other  endocrine, nutritional and metabolic disease, History of hypothyroidism - 2014, History of hypercholesterolemia, - 2014    FAMILY HISTORY: Diabetes - Father Family Health Status Number - Runs In Family nephrolithiasis - Father   SOCIAL HISTORY: Marital Status: Married Preferred Language: English; Ethnicity: Not Hispanic Or Latino; Race: White Current Smoking Status: Patient smokes.  Does not drink anymore.  Drinks 1 caffeinated drink per day.     Notes: Current some day smoker, Tobacco use, Caffeine Use, Alcohol Use, Occupation:, Marital History - Currently Married   REVIEW OF SYSTEMS:    GU Review Female:   Patient reports frequent urination, hard to postpone urination, and burning /pain with urination. Patient denies get up at night to urinate, leakage of urine, stream starts and stops, trouble starting your stream, have to strain to urinate, and being pregnant.  Gastrointestinal (Upper):   Patient reports indigestion/ heartburn. Patient denies nausea and vomiting.  Gastrointestinal (Lower):   Patient denies diarrhea and constipation.  Constitutional:   Patient denies fever, night sweats, weight loss, and fatigue.  Skin:   Patient denies itching and skin rash/ lesion.  Eyes:   Patient denies blurred vision and double vision.  Ears/ Nose/ Throat:   Patient denies sore throat and sinus problems.  Hematologic/Lymphatic:   Patient denies swollen glands and easy bruising.  Cardiovascular:   Patient denies leg swelling and chest pains.  Respiratory:   Patient denies cough and shortness of breath.  Endocrine:   Patient denies excessive thirst.  Musculoskeletal:   Patient denies back pain and joint pain.  Neurological:   Patient denies headaches and dizziness.  Psychologic:  Patient denies depression and anxiety.   VITAL SIGNS:      03/07/2020 01:05 PM  Weight 102 lb / 46.27 kg  Height 62 in / 157.48 cm  BP 151/85 mmHg  Heart Rate 90 /min  Temperature 97.3 F / 36.2 C  BMI 18.7 kg/m    GU PHYSICAL EXAMINATION:    Breast: Symmetrical. No tenderness, no nipple discharge, no skin changes. No mass.  Digital Rectal Exam: Normal sphincter tone. No rectal mass.  External Genitalia: No hirsutism, no rash, no scarring, no cyst, no erythematous lesion, no papular lesion, no blanched lesion, no warty lesion. No edema.  Urethral Meatus: Normal size. Normal position. No discharge.  Urethra: No tenderness, no mass, no scarring. No hypermobility. No leakage.  Bladder: Normal to palpation, no tenderness, no mass, normal size.  Vagina: No atrophy, no stenosis. No rectocele. No cystocele. No enterocele.  Cervix: No inflammation, no discharge, no lesion, no tenderness, no wart.  Uterus: Normal size. Normal consistency. Normal position. No mobility. No descent.  Adnexa / Parametria: No tenderness. No adnexal mass. Normal left ovary. Normal right ovary.  Anus and Perineum: No hemorrhoids. No anal stenosis. No rectal fissure, no anal fissure. No edema, no dimple, no perineal tenderness, no anal tenderness.   Notes: No CVA tenderness.   MULTI-SYSTEM PHYSICAL EXAMINATION:    Constitutional: Well-nourished. No physical deformities. Normally developed. Good grooming.  Respiratory: No labored breathing, no use of accessory muscles.   Cardiovascular: Normal temperature, normal extremity pulses, no swelling, no varicosities.  Skin: No paleness, no jaundice, no cyanosis. No lesion, no ulcer, no rash.  Neurologic / Psychiatric: Oriented to time, oriented to place, oriented to person. No depression, no anxiety, no agitation.  Gastrointestinal: No mass, no tenderness, no rigidity, non obese abdomen.  Musculoskeletal: Normal gait and station of head and neck.     Complexity of Data:  Source Of History:  Patient, Medical Record Summary  Records Review:   Previous Doctor Records, Previous Hospital Records, Previous Patient Records  Urine Test Review:   Urinalysis, Urine Culture  X-Ray Review: KUB:  Reviewed Films.  C.T. Abdomen/Pelvis: Reviewed Films. Reviewed Report. Discussed With Patient.     03/07/20  Urinalysis  Urine Appearance Clear   Urine Color Straw   Urine Glucose Neg mg/dL  Urine Bilirubin Neg mg/dL  Urine Ketones Neg mg/dL  Urine Specific Gravity 1.010   Urine Blood Trace ery/uL  Urine pH 6.0   Urine Protein Neg mg/dL  Urine Urobilinogen 0.2 mg/dL  Urine Nitrites Neg   Urine Leukocyte Esterase Trace leu/uL  Urine WBC/hpf 0 - 5/hpf   Urine RBC/hpf 0 - 2/hpf   Urine Epithelial Cells NS (Not Seen)   Urine Bacteria Rare (0-9/hpf)   Urine Mucous Not Present   Urine Yeast NS (Not Seen)   Urine Trichomonas Not Present   Urine Cystals Ca Oxalate   Urine Casts NS (Not Seen)   Urine Sperm Not Present    PROCEDURES:         C.T. Urogram - [id number]      Patient confirmed No Neulasta OnPro Device.         Urinalysis w/Scope Dipstick Dipstick Cont'd Micro  Color: Straw Bilirubin: Neg mg/dL WBC/hpf: 0 - 5/hpf  Appearance: Clear Ketones: Neg mg/dL RBC/hpf: 0 - 2/hpf  Specific Gravity: 1.010 Blood: Trace ery/uL Bacteria: Rare (0-9/hpf)  pH: 6.0 Protein: Neg mg/dL Cystals: Ca Oxalate  Glucose: Neg mg/dL Urobilinogen: 0.2 mg/dL Casts: NS (Not Seen)    Nitrites: Neg  Trichomonas: Not Present    Leukocyte Esterase: Trace leu/uL Mucous: Not Present      Epithelial Cells: NS (Not Seen)      Yeast: NS (Not Seen)      Sperm: Not Present    ASSESSMENT:      ICD-10 Details  1 GU:   Ureteral calculus - N20.1 Left, Acute, Systemic Symptoms   PLAN:            Medications New Meds: Ondansetron Hcl 4 mg tablet 1 tablet PO Q 6 H PRN as needed for nausea  #20  0 Refill(s)  Oxycodone-Acetaminophen 5 mg-325 mg tablet 1 tablet PO Q 6 H PRN as needed for severe pain  #20  0 Refill(s)            Orders Labs CULTURE, URINE  X-Ray Notes: History:  Hematuria: Yes/No  Patient to see MD after exam: Yes/No  Previous exam: CT / IVP/ US/ KUB/ None  When:  Where:  Diabetic:  Yes/ No  BUN/ Creatine:  Date of last BUN Creatinine:  Weight in pounds:  Allergy- Contrasts/ Shellfish: Yes/ No  Conflicting diabetic meds: Yes/ No  Oral contrast and instructions given to patient:   Prior Authorization #: [id number]            Schedule X-Rays: C.T. Stone Protocol Without Contrast - Lower abdominal pain, gross hematuria, hsitory of stones, she has a dupilicated collection system as well and has negative urine cultures  Return Visit/Planned Activity: Next Available Appointment - Schedule Surgery          Document Letter(s):  Created for Patient: Clinical Summary         Notes:   Urinalysis a is without infectious parameters today. I will send for precautionary culture. Based off of her symptoms I was concerned for a distal ureteral stone and advised the perform a CT scan to assess for this. She was in agreement today CT scan does show concern for 2 distal left stones with moderate hydro. I discussed this finding with her urologist and went over options for treatment. We discussed medical expulsive therapy as well as a ureteroscopy. She would like to proceed with ureteroscopy. The risks of ureteroscopy including infection, bleeding, injury to surrounding structure and general risks of anesthesia were discussed today. She verbalized her understanding. Strict return precautions for fever, chills, nausea, vomiting, uncontrolled pain were given. Prescriptions for pain and nausea sent to pharmacy today.

## 2020-03-23 NOTE — Anesthesia Preprocedure Evaluation (Signed)
Anesthesia Evaluation  Patient identified by MRN, date of birth, ID band Patient awake    Reviewed: Allergy & Precautions, NPO status , Patient's Chart, lab work & pertinent test results  Airway Mallampati: II  TM Distance: >3 FB Neck ROM: Full    Dental no notable dental hx.    Pulmonary Current Smoker and Patient abstained from smoking.,    Pulmonary exam normal breath sounds clear to auscultation       Cardiovascular Normal cardiovascular exam Rhythm:Regular Rate:Normal  WPW   Neuro/Psych negative neurological ROS  negative psych ROS   GI/Hepatic negative GI ROS, Neg liver ROS,   Endo/Other  negative endocrine ROS  Renal/GU negative Renal ROS  negative genitourinary   Musculoskeletal negative musculoskeletal ROS (+)   Abdominal   Peds negative pediatric ROS (+)  Hematology negative hematology ROS (+)   Anesthesia Other Findings   Reproductive/Obstetrics negative OB ROS                             Anesthesia Physical Anesthesia Plan  ASA: II  Anesthesia Plan: General   Post-op Pain Management:    Induction: Intravenous  PONV Risk Score and Plan: 2 and Ondansetron, Dexamethasone and Treatment may vary due to age or medical condition  Airway Management Planned: LMA  Additional Equipment:   Intra-op Plan:   Post-operative Plan: Extubation in OR  Informed Consent: I have reviewed the patients History and Physical, chart, labs and discussed the procedure including the risks, benefits and alternatives for the proposed anesthesia with the patient or authorized representative who has indicated his/her understanding and acceptance.     Dental advisory given  Plan Discussed with: CRNA and Surgeon  Anesthesia Plan Comments:         Anesthesia Quick Evaluation

## 2020-03-23 NOTE — Discharge Instructions (Signed)
DISCHARGE INSTRUCTIONS FOR KIDNEY STONE/URETERAL STENT   MEDICATIONS:  1. Resume all your other meds from home - except do not take any extra narcotic pain meds that you may have at home.  2. Take Cipro one hour prior to removal of your stent.   ACTIVITY:  1. No strenuous activity x 1week  2. No driving while on narcotic pain medications  3. Drink plenty of water  4. Continue to walk at home - you can still get blood clots when you are at home, so keep active, but don't over do it.  5. May return to work/school tomorrow or when you feel ready   BATHING:  1. You can shower and we recommend daily showers  2. You have a string coming from your urethra: The stent string is attached to your ureteral stent. Do not pull on this.   SIGNS/SYMPTOMS TO CALL:  Please call us if you have a fever greater than 101.5, uncontrolled nausea/vomiting, uncontrolled pain, dizziness, unable to urinate, bloody urine, chest pain, shortness of breath, leg swelling, leg pain, redness around wound, drainage from wound, or any other concerns or questions.   You can reach [place] at [phone].   FOLLOW-UP:  1. You have an appointment in 6 weeks with a ultrasound of your kidneys prior.  2. You have a string attached to your stent, you may remove it on Monday, Oct 25th. To do this, pull the strings until the stents are completely removed. You may feel an odd sensation in your back.  [facility] [phone] Post Ureteroscopy With Stent Instructions  Definitions:  Ureter: The duct that transports urine from the kidney to the bladder. Stent:   A plastic hollow tube that is placed into the ureter, from the kidney to the bladder to prevent the ureter from swelling shut.  GENERAL INSTRUCTIONS:  Despite the fact that no skin incisions were used, the area around the ureter and bladder is raw and irritated. The stent is a foreign body which will further irritate the bladder wall. This irritation is  manifested by increased frequency of urination, both day and night, and by an increase in the urge to urinate. In some, the urge to urinate is present almost always. Sometimes the urge is strong enough that you may not be able to stop yourself from urinating. The only real cure is to remove the stent and then give time for the bladder wall to heal which can't be done until the danger of the ureter swelling shut has passed, which varies.  You may see some blood in your urine while the stent is in place and a few days afterwards. Do not be alarmed, even if the urine was clear for a while. Get off your feet and drink lots of fluids until clearing occurs. If you start to pass clots or don't improve, call us.  DIET: You may return to your normal diet immediately. Because of the raw surface of your bladder, alcohol, spicy foods, acid type foods and drinks with caffeine may cause irritation or frequency and should be used in moderation. To keep your urine flowing freely and to avoid constipation, drink plenty of fluids during the day ( 8-10 glasses ). Tip: Avoid cranberry juice because it is very acidic.  ACTIVITY: Your physical activity doesn't need to be restricted. However, if you are very active, you may see some blood in your urine. We suggest that you reduce your activity under these circumstances until the bleeding has stopped.  BOWELS:  It is important to keep your bowels regular during the postoperative period. Straining with bowel movements can cause bleeding. A bowel movement every other day is reasonable. Use a mild laxative if needed, such as Milk of Magnesia 2-3 tablespoons, or 2 Dulcolax tablets. Call if you continue to have problems. If you have been taking narcotics for pain, before, during or after your surgery, you may be constipated. Take a laxative if necessary.   MEDICATION: You should resume your pre-surgery medications unless told not to. In addition you will often be given an  antibiotic to prevent infection. These should be taken as prescribed until the bottles are finished unless you are having an unusual reaction to one of the drugs.  PROBLEMS YOU SHOULD REPORT TO [place]:  Fevers over 100.5 Fahrenheit.  Heavy bleeding, or clots ( See above notes about blood in urine ).  Inability to urinate.  Drug reactions ( hives, rash, nausea, vomiting, diarrhea ).  Severe burning or pain with urination that is not improving.  FOLLOW-UP: You will need a follow-up appointment to monitor your progress. Call for this appointment at the number listed above. Usually the first appointment will be about three to fourteen days after your surgery.    Post Anesthesia Home Care Instructions  Activity: Get plenty of rest for the remainder of the day. A responsible individual must stay with you for 24 hours following the procedure.  For the next 24 hours, DO NOT: -Drive a car -Paediatric nurse -Drink alcoholic beverages -Take any medication unless instructed by your physician -Make any legal decisions or sign important papers.  Meals: Start with liquid foods such as gelatin or soup. Progress to regular foods as tolerated. Avoid greasy, spicy, heavy foods. If nausea and/or vomiting occur, drink only clear liquids until the nausea and/or vomiting subsides. Call your physician if vomiting continues.  Special Instructions/Symptoms: Your throat may feel dry or sore from the anesthesia or the breathing tube placed in your throat during surgery. If this causes discomfort, gargle with warm salt water. The discomfort should disappear within 24 hours.

## 2020-03-23 NOTE — Anesthesia Procedure Notes (Signed)
Procedure Name: LMA Insertion Date/Time: 03/23/2020 7:43 AM Performed by: Mechele Claude, CRNA Pre-anesthesia Checklist: Patient identified, Emergency Drugs available, Suction available and Patient being monitored Patient Re-evaluated:Patient Re-evaluated prior to induction Oxygen Delivery Method: Circle system utilized Preoxygenation: Pre-oxygenation with 100% oxygen Induction Type: IV induction Ventilation: Mask ventilation without difficulty LMA: LMA inserted LMA Size: 3.0 Number of attempts: 1 Airway Equipment and Method: Bite block Placement Confirmation: positive ETCO2 Tube secured with: Tape Dental Injury: Teeth and Oropharynx as per pre-operative assessment

## 2020-03-24 ENCOUNTER — Encounter (HOSPITAL_BASED_OUTPATIENT_CLINIC_OR_DEPARTMENT_OTHER): Payer: Self-pay | Admitting: Urology

## 2020-03-27 ENCOUNTER — Ambulatory Visit: Payer: BC Managed Care – PPO

## 2020-04-18 ENCOUNTER — Other Ambulatory Visit: Payer: Self-pay

## 2020-04-18 ENCOUNTER — Ambulatory Visit
Admission: RE | Admit: 2020-04-18 | Discharge: 2020-04-18 | Disposition: A | Discharge status: Home / Self Care | Payer: BC Managed Care – PPO | Source: Ambulatory Visit | Attending: General Practice | Admitting: General Practice

## 2020-04-18 DIAGNOSIS — Z1231 Encounter for screening mammogram for malignant neoplasm of breast: Secondary | ICD-10-CM

## 2020-04-24 ENCOUNTER — Other Ambulatory Visit: Payer: Self-pay | Admitting: General Practice

## 2020-04-24 DIAGNOSIS — R928 Other abnormal and inconclusive findings on diagnostic imaging of breast: Secondary | ICD-10-CM

## 2020-04-25 ENCOUNTER — Ambulatory Visit (AMBULATORY_SURGERY_CENTER): Payer: Self-pay | Admitting: *Deleted

## 2020-04-25 ENCOUNTER — Other Ambulatory Visit: Payer: Self-pay

## 2020-04-25 VITALS — Ht 61.0 in | Wt 103.0 lb

## 2020-04-25 DIAGNOSIS — Z01818 Encounter for other preprocedural examination: Secondary | ICD-10-CM

## 2020-04-25 DIAGNOSIS — Z1211 Encounter for screening for malignant neoplasm of colon: Secondary | ICD-10-CM

## 2020-04-25 NOTE — Progress Notes (Signed)
No egg or soy allergy known to patient  No issues with past sedation with any surgeries or procedures no intubation problems in the past  No FH of Malignant Hyperthermia No diet pills per patient No home 02 use per patient  No blood thinners per patient  Pt denies issues with constipation  No A fib or A flutter  EMMI video to pt or via MyChart  COVID 19 guidelines implemented in [facility] today with Pt and RN   cov test 12-3 12 noon   Due to the COVID-19 pandemic we are asking patients to follow these guidelines. Please only bring one care partner. Please be aware that your care partner may wait in the car in the parking lot or if they feel like they will be too hot to wait in the car, they may wait in the lobby on the 4th floor. All care partners are required to wear a mask the entire time (we do not have any that we can provide them), they need to practice social distancing, and we will do a Covid check for all patient's and care partners when you arrive. Also we will check their temperature and your temperature. If the care partner waits in their car they need to stay in the parking lot the entire time and we will call them on their cell phone when the patient is ready for discharge so they can bring the car to the front of the building. Also all patient's will need to wear a mask into building.

## 2020-04-26 ENCOUNTER — Encounter: Payer: Self-pay | Admitting: Internal Medicine

## 2020-05-04 ENCOUNTER — Ambulatory Visit: Payer: BC Managed Care – PPO

## 2020-05-04 ENCOUNTER — Other Ambulatory Visit: Payer: Self-pay

## 2020-05-04 ENCOUNTER — Ambulatory Visit
Admission: RE | Admit: 2020-05-04 | Discharge: 2020-05-04 | Disposition: A | Discharge status: Home / Self Care | Payer: BC Managed Care – PPO | Source: Ambulatory Visit | Attending: General Practice | Admitting: General Practice

## 2020-05-04 DIAGNOSIS — R928 Other abnormal and inconclusive findings on diagnostic imaging of breast: Secondary | ICD-10-CM

## 2020-05-05 ENCOUNTER — Other Ambulatory Visit: Payer: Self-pay | Admitting: Internal Medicine

## 2020-05-05 LAB — SARS CORONAVIRUS 2 (TAT 6-24 HRS): SARS Coronavirus 2: NEGATIVE

## 2020-05-09 ENCOUNTER — Other Ambulatory Visit: Payer: Self-pay

## 2020-05-09 ENCOUNTER — Encounter: Payer: Self-pay | Admitting: Internal Medicine

## 2020-05-09 ENCOUNTER — Encounter: Payer: BC Managed Care – PPO | Admitting: Gastroenterology

## 2020-05-09 ENCOUNTER — Ambulatory Visit (AMBULATORY_SURGERY_CENTER): Payer: BC Managed Care – PPO | Admitting: Internal Medicine

## 2020-05-09 VITALS — BP 107/75 | HR 61 | Temp 98.7°F | Resp 12 | Ht 61.0 in | Wt 103.0 lb

## 2020-05-09 DIAGNOSIS — Z1211 Encounter for screening for malignant neoplasm of colon: Secondary | ICD-10-CM

## 2020-05-09 DIAGNOSIS — D125 Benign neoplasm of sigmoid colon: Secondary | ICD-10-CM

## 2020-05-09 MED ORDER — SODIUM CHLORIDE 0.9 % IV SOLN: 500.0000 mL | Freq: Once | INTRAVENOUS | Status: DC

## 2020-05-09 NOTE — Op Note (Signed)
[facility] Patient Name: Lauren Sanchez Procedure Date: 05/09/2020 10:49 AM MRN: [id number] Endoscopist: Gatha Mayer , MD Age: 64 Referring MD:  Date of Birth: February 21, 1956 Gender: Female Account #: [id number] Procedure:                Colonoscopy Indications:              Screening for colorectal malignant neoplasm, Last                            colonoscopy: 2011 Medicines:                Propofol per Anesthesia, Monitored Anesthesia Care Procedure:                Pre-Anesthesia Assessment:                           - Prior to the procedure, a History and Physical                            was performed, and patient medications and                            allergies were reviewed. The patient's tolerance of                            previous anesthesia was also reviewed. The risks                            and benefits of the procedure and the sedation                            options and risks were discussed with the patient.                            All questions were answered, and informed consent                            was obtained. Prior Anticoagulants: The patient has                            taken no previous anticoagulant or antiplatelet                            agents. ASA Grade Assessment: II - A patient with                            mild systemic disease. After reviewing the risks                            and benefits, the patient was deemed in                            satisfactory condition to undergo the procedure.  After obtaining informed consent, the colonoscope                            was passed under direct vision. Throughout the                            procedure, the patient's blood pressure, pulse, and                            oxygen saturations were monitored continuously. The                            Colonoscope was introduced through the anus and                            advanced to the the  cecum, identified by                            appendiceal orifice and ileocecal valve. The                            colonoscopy was somewhat difficult due to                            significant looping. Successful completion of the                            procedure was aided by applying abdominal pressure.                            The patient tolerated the procedure well. The                            quality of the bowel preparation was good. The                            ileocecal valve, appendiceal orifice, and rectum                            were photographed. The bowel preparation used was                            Miralax via split dose instruction. Scope In: 11:03:27 AM Scope Out: 11:22:29 AM Scope Withdrawal Time: 0 hours 11 minutes 32 seconds  Total Procedure Duration: 0 hours 19 minutes 2 seconds  Findings:                 The perianal and digital rectal examinations were                            normal.                           A 3 mm polyp was found in the proximal sigmoid  colon. The polyp was sessile. The polyp was removed                            with a cold snare. Resection and retrieval were                            complete. Verification of patient identification                            for the specimen was done. Estimated blood loss was                            minimal.                           Multiple small and large-mouthed diverticula were                            found in the sigmoid colon and descending colon.                           External hemorrhoids were found.                           The exam was otherwise without abnormality on                            direct and retroflexion views. Complications:            No immediate complications. Estimated Blood Loss:     Estimated blood loss was minimal. Impression:               - One 3 mm polyp in the proximal sigmoid colon,                             removed with a cold snare. Resected and retrieved.                           - Moderate diverticulosis in the sigmoid colon and                            in the descending colon.                           - External hemorrhoids.                           - The examination was otherwise normal on direct                            and retroflexion views. Recommendation:           - Patient has a contact number available for                            emergencies. The signs and symptoms of potential  delayed complications were discussed with the                            patient. Return to normal activities tomorrow.                            Written discharge instructions were provided to the                            patient.                           - Resume previous diet.                           - Continue present medications.                           - Repeat colonoscopy is recommended. The                            colonoscopy date will be determined after pathology                            results from today's exam become available for                            review. Gatha Mayer, MD 05/09/2020 11:31:35 AM This report has been signed electronically.

## 2020-05-09 NOTE — Progress Notes (Signed)
A/ox3, pleased with MAC, report to RN 

## 2020-05-09 NOTE — Patient Instructions (Addendum)
I found and removed one tiny polyp that I am certain is benign.  It looks like it is probably precancerous, which is common and nothing to worry about.  We remove them so they cannot turn into cancer which was not destined to happen but was possible.  I suspect he will end up with a recommendation to repeat a colonoscopy as early as 76 and his latest 10 years.  I also saw diverticulosis again.  Please read the handout we provide.  Everything else looked fine, the cleanout was good and I did not have any significant difficulty passing the scope.  I appreciate the opportunity to care for you. Gatha Mayer MD, [facility]   Handouts given for polyps,hemorrhoids, high fiber diet and diverticulosis.  YOU HAD AN ENDOSCOPIC PROCEDURE TODAY AT [facility]:   Refer to the procedure report that was given to you for any specific questions about what was found during the examination.  If the procedure report does not answer your questions, please call your gastroenterologist to clarify.  If you requested that your care partner not be given the details of your procedure findings, then the procedure report has been included in a sealed envelope for you to review at your convenience later.  YOU SHOULD EXPECT: Some feelings of bloating in the abdomen. Passage of more gas than usual.  Walking can help get rid of the air that was put into your GI tract during the procedure and reduce the bloating. If you had a lower endoscopy (such as a colonoscopy or flexible sigmoidoscopy) you may notice spotting of blood in your stool or on the toilet paper. If you underwent a bowel prep for your procedure, you may not have a normal bowel movement for a few days.  Please Note:  You might notice some irritation and congestion in your nose or some drainage.  This is from the oxygen used during your procedure.  There is no need for concern and it should clear up in a day or so.  SYMPTOMS TO REPORT  IMMEDIATELY:   Following lower endoscopy (colonoscopy or flexible sigmoidoscopy):  Excessive amounts of blood in the stool  Significant tenderness or worsening of abdominal pains  Swelling of the abdomen that is new, acute  Fever of 100F or higher  For urgent or emergent issues, a gastroenterologist can be reached at any hour by calling [phone]. Do not use MyChart messaging for urgent concerns.    DIET:  We do recommend a small meal at first, but then you may proceed to your regular diet.  Drink plenty of fluids but you should avoid alcoholic beverages for 24 hours.  ACTIVITY:  You should plan to take it easy for the rest of today and you should NOT DRIVE or use heavy machinery until tomorrow (because of the sedation medicines used during the test).    FOLLOW UP: Our staff will call the number listed on your records 48-72 hours following your procedure to check on you and address any questions or concerns that you may have regarding the information given to you following your procedure. If we do not reach you, we will leave a message.  We will attempt to reach you two times.  During this call, we will ask if you have developed any symptoms of COVID 19. If you develop any symptoms (ie: fever, flu-like symptoms, shortness of breath, cough etc.) before then, please call [phone].  If you test positive for Covid 19 in the 2  weeks post procedure, please call and report this information to [place].    If any biopsies were taken you will be contacted by phone or by letter within the next 1-3 weeks.  Please call us at [phone] if you have not heard about the biopsies in 3 weeks.    SIGNATURES/CONFIDENTIALITY: You and/or your care partner have signed paperwork which will be entered into your electronic medical record.  These signatures attest to the fact that that the information above on your After Visit Summary has been reviewed and is understood.  Full responsibility of the  confidentiality of this discharge information lies with you and/or your care-partner.

## 2020-05-09 NOTE — Progress Notes (Signed)
Called to room to assist during endoscopic procedure.  Patient ID and intended procedure confirmed with present staff. Received instructions for my participation in the procedure from the performing physician.  

## 2020-05-09 NOTE — Progress Notes (Signed)
Pt's states no medical or surgical changes since previsit or office visit. VS by CW. 

## 2020-05-11 ENCOUNTER — Telehealth: Payer: Self-pay

## 2020-05-11 NOTE — Telephone Encounter (Signed)
Covid-19 screening questions   Do you now or have you had a fever in the last 14 days? No.  Do you have any respiratory symptoms of shortness of breath or cough now or in the last 14 days? No.  Do you have any family members or close contacts with diagnosed or suspected Covid-19 in the past 14 days? No.  Have you been tested for Covid-19 and found to be positive? No.       Follow up Call-  Call back number 05/09/2020  Post procedure Call Back phone  # [phone]  Permission to leave phone message Yes  Some recent data might be hidden     Patient questions:  Do you have a fever, pain , or abdominal swelling? Yes.   Pain Score  6 *  Pt. Reports she was on a lengthy car trip yesterday, and when she sharply twisted to retreive something in the car, had a very sharp pain (level) 6.   Pt. Has returned to her diet and activities, only a few drops of blood passed following her procedure.  Told pt. To walk some today before continuing her travels, have something hot to drink, get on her hands and knees and rock back and forth, to facilitate the release of any trapped air, and call if she has any further questions or concerns, and that her report of this would be documented.  Have you tolerated food without any problems? Yes.    Have you been able to return to your normal activities? Yes.    Do you have any questions about your discharge instructions: Diet   No. Medications  No. Follow up visit  No.  Do you have questions or concerns about your Care? Yes.    Actions: * If pain score is 4 or above: Physician/ provider Notified : Silvano Rusk, MD.

## 2020-05-23 ENCOUNTER — Encounter: Payer: Self-pay | Admitting: Internal Medicine

## 2020-05-23 DIAGNOSIS — Z86010 Personal history of colonic polyps: Secondary | ICD-10-CM | POA: Insufficient documentation

## 2020-12-07 ENCOUNTER — Other Ambulatory Visit: Payer: Self-pay | Admitting: Obstetrics and Gynecology

## 2020-12-07 DIAGNOSIS — N6320 Unspecified lump in the left breast, unspecified quadrant: Secondary | ICD-10-CM

## 2020-12-19 ENCOUNTER — Ambulatory Visit
Admission: RE | Admit: 2020-12-19 | Discharge: 2020-12-19 | Disposition: A | Discharge status: Home / Self Care | Payer: BC Managed Care – PPO | Source: Ambulatory Visit | Attending: Obstetrics and Gynecology | Admitting: Obstetrics and Gynecology

## 2020-12-19 ENCOUNTER — Other Ambulatory Visit: Payer: Self-pay

## 2020-12-19 DIAGNOSIS — N6320 Unspecified lump in the left breast, unspecified quadrant: Secondary | ICD-10-CM

## 2021-04-11 ENCOUNTER — Other Ambulatory Visit: Payer: Self-pay | Admitting: Obstetrics and Gynecology

## 2021-04-11 DIAGNOSIS — Z1231 Encounter for screening mammogram for malignant neoplasm of breast: Secondary | ICD-10-CM

## 2021-05-10 ENCOUNTER — Ambulatory Visit: Payer: BC Managed Care – PPO

## 2021-05-14 ENCOUNTER — Ambulatory Visit
Admission: RE | Admit: 2021-05-14 | Discharge: 2021-05-14 | Disposition: A | Discharge status: Home / Self Care | Payer: BC Managed Care – PPO | Source: Ambulatory Visit | Attending: Obstetrics and Gynecology | Admitting: Obstetrics and Gynecology

## 2021-05-14 DIAGNOSIS — Z1231 Encounter for screening mammogram for malignant neoplasm of breast: Secondary | ICD-10-CM

## 2021-10-07 IMAGING — MG MM DIGITAL DIAGNOSTIC UNILAT*L* W/ TOMO W/ CAD
4 series · 4 of 12 positions shown · non-contrast
Comparison: Previous exams including recent screening mammogram
dated 04/18/2020.

CLINICAL DATA: Patient returns today to evaluate a possible LEFT
breast asymmetry questioned on recent screening mammogram.

EXAM:
DIGITAL DIAGNOSTIC UNILATERAL LEFT MAMMOGRAM WITH TOMO AND CAD

[L MLO synth-2D]
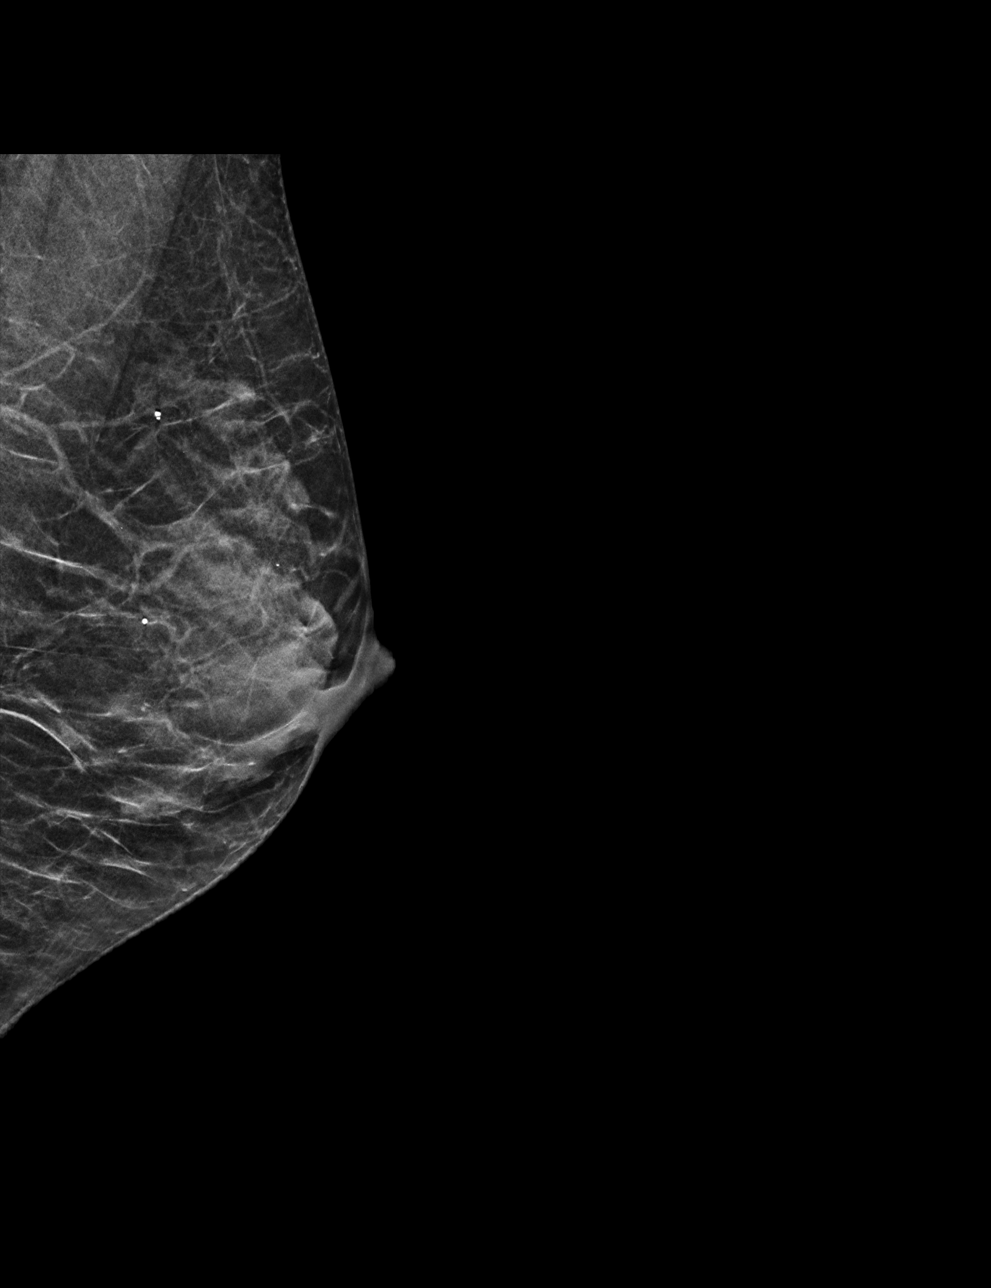

[L CC synth-2D]
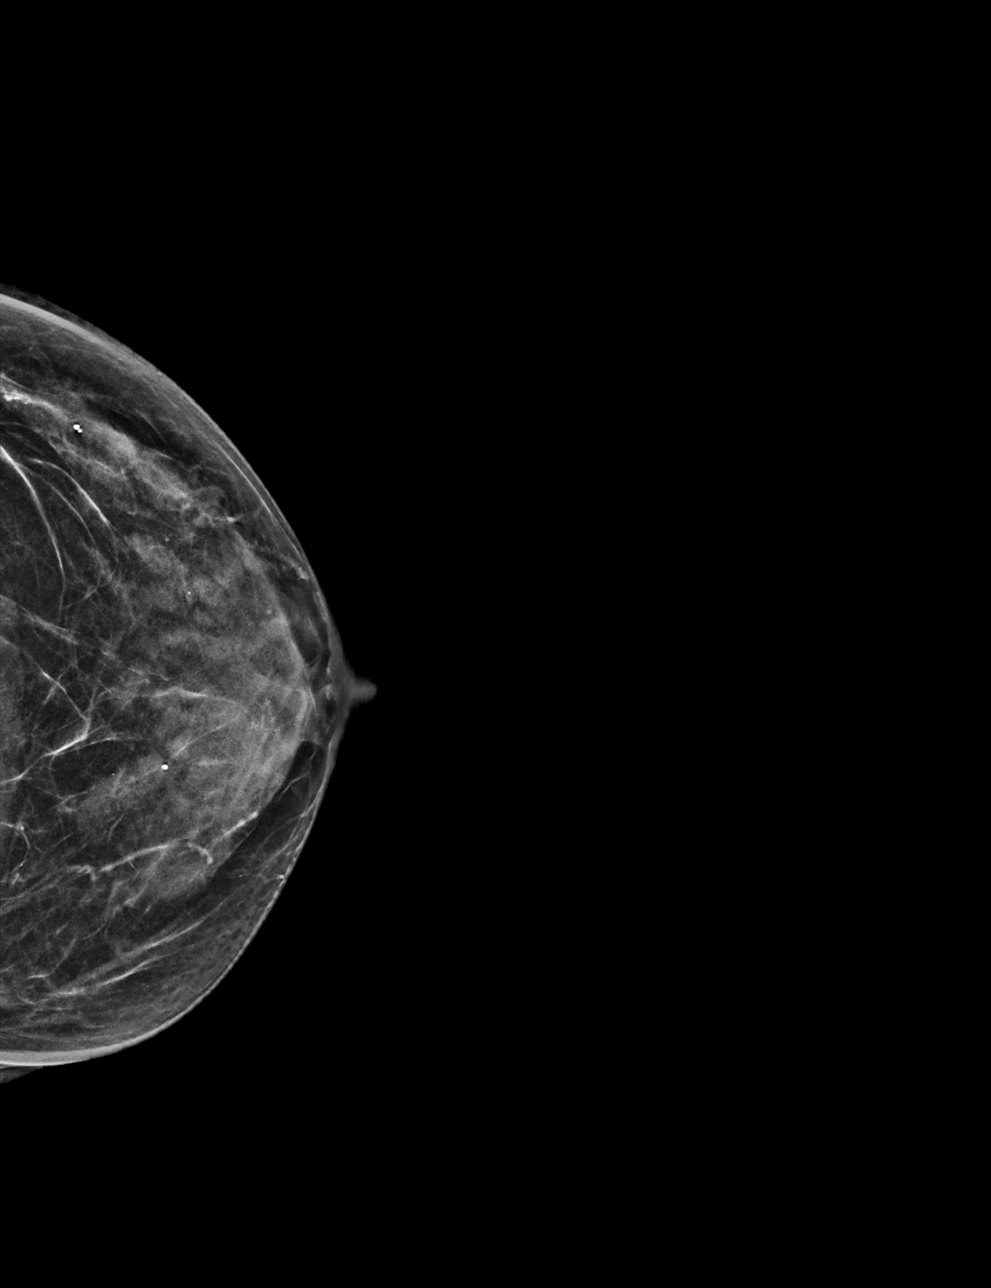

[L CC tomo · tomo slice 19/38.0]
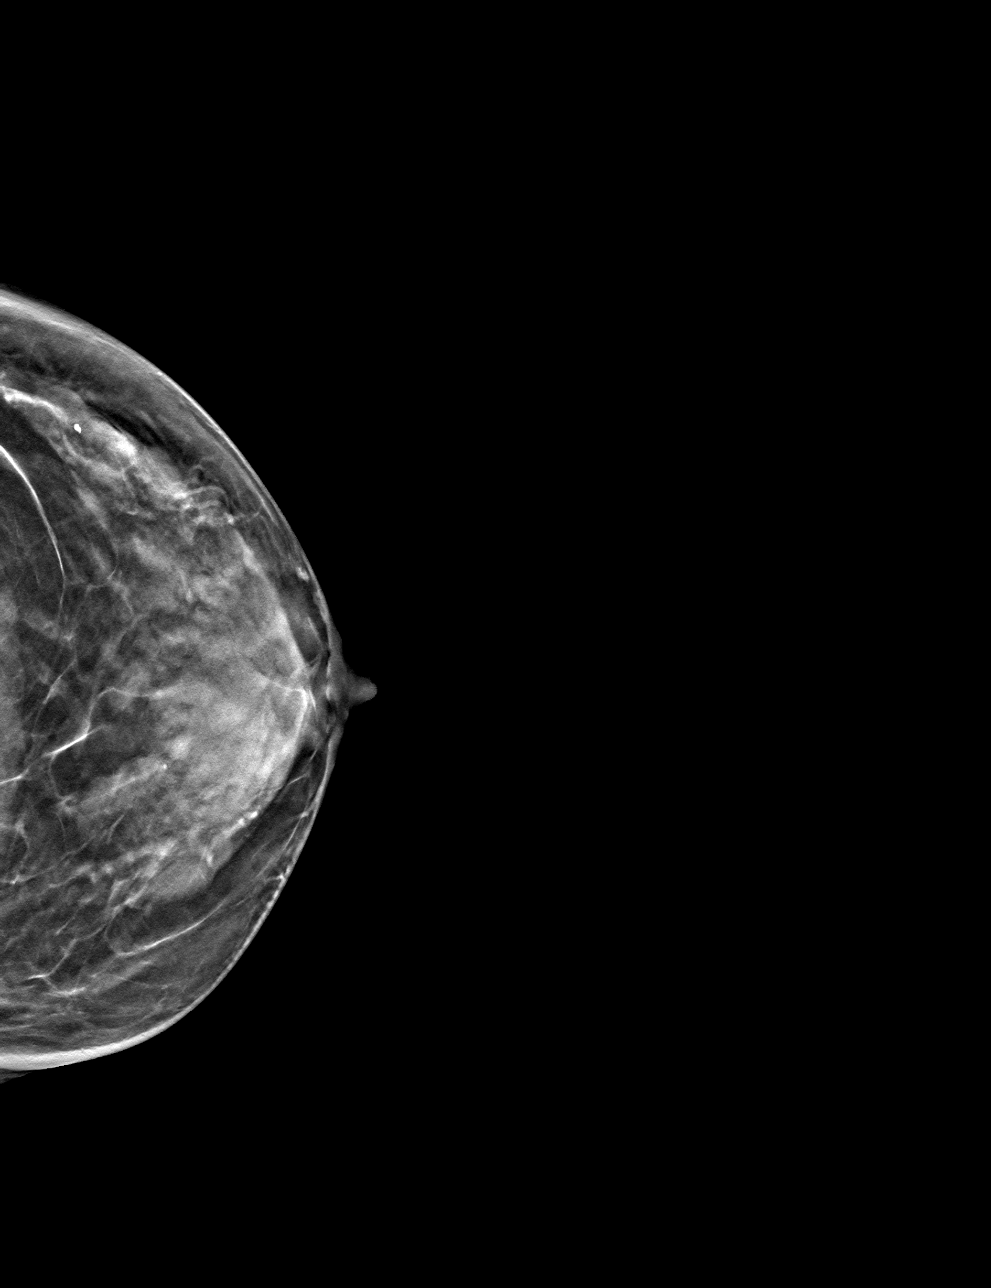

[L MLO tomo · tomo slice 17/34.0]
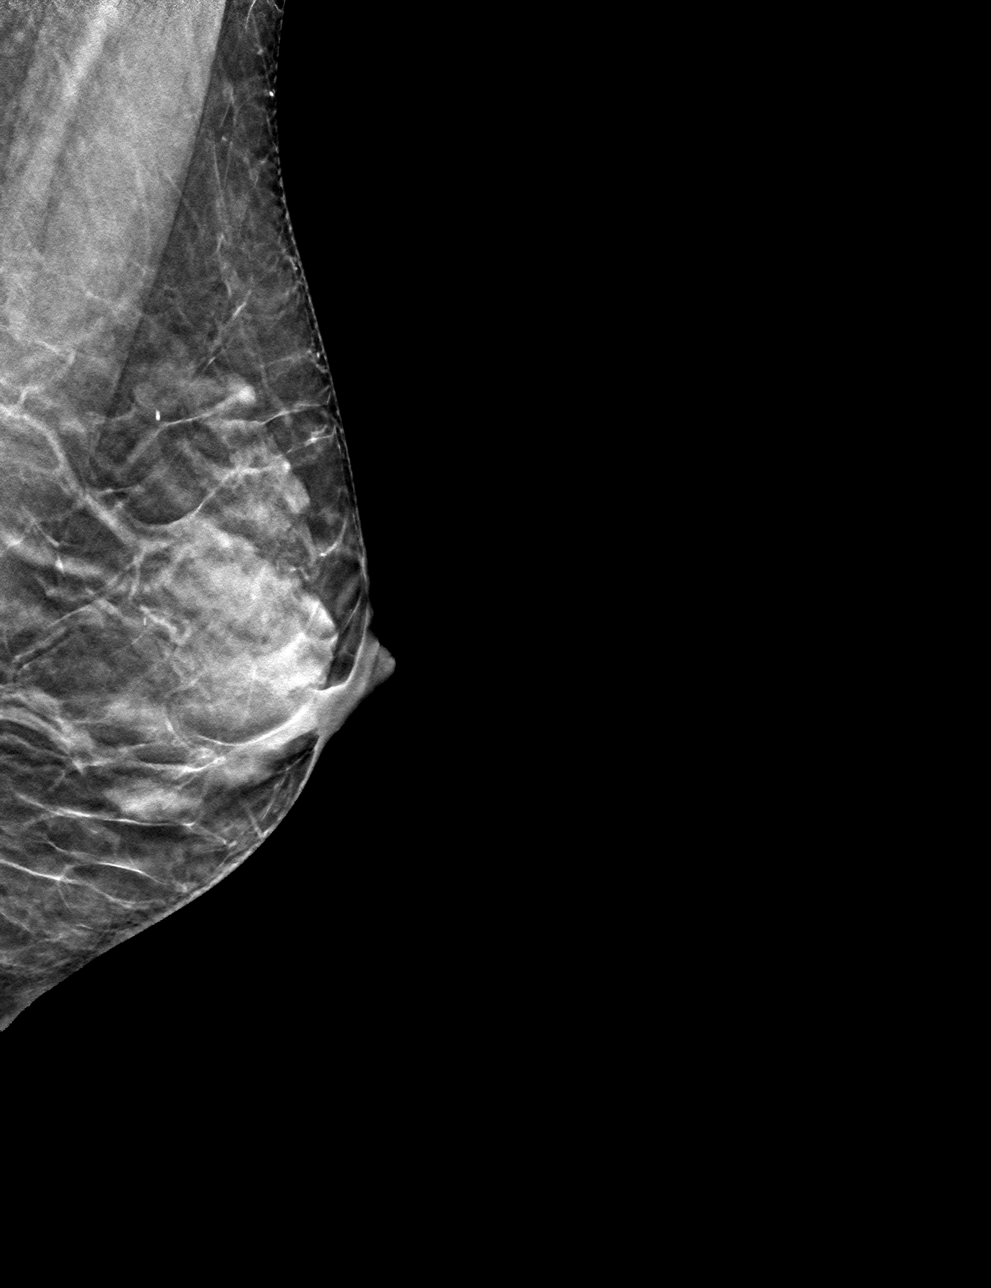

[4 of 12 positions shown; findings below may reference images not displayed]

ACR Breast Density Category d: The breast tissue is extremely dense,
which lowers the sensitivity of mammography.
FINDINGS: On today's additional diagnostic views with 3D tomosynthesis, there
is no persistent asymmetry within the LEFT breast indicating
superimposition of normal dense fibroglandular tissues.

Mammographic images were processed with CAD.
IMPRESSION: No evidence of malignancy.

Patient may return to routine annual bilateral screening mammogram
schedule.

RECOMMENDATION:
Screening mammogram in one year.(Code:[id number]-J-34H)

I have discussed the findings and recommendations with the patient.
If applicable, a reminder letter will be sent to the patient
regarding the next appointment.

BI-RADS CATEGORY  1: Negative.

## 2022-03-28 ENCOUNTER — Other Ambulatory Visit: Payer: Self-pay | Admitting: Urology

## 2022-04-06 ENCOUNTER — Other Ambulatory Visit: Payer: Self-pay

## 2022-04-06 ENCOUNTER — Encounter (HOSPITAL_BASED_OUTPATIENT_CLINIC_OR_DEPARTMENT_OTHER): Payer: Self-pay

## 2022-04-06 ENCOUNTER — Emergency Department (HOSPITAL_BASED_OUTPATIENT_CLINIC_OR_DEPARTMENT_OTHER)
Admission: EM | Admit: 2022-04-06 | Discharge: 2022-04-06 | Disposition: A | Discharge status: Home / Self Care | Payer: Medicare Other | Attending: Emergency Medicine | Admitting: Emergency Medicine

## 2022-04-06 ENCOUNTER — Encounter (HOSPITAL_COMMUNITY): Payer: Self-pay

## 2022-04-06 ENCOUNTER — Emergency Department (HOSPITAL_BASED_OUTPATIENT_CLINIC_OR_DEPARTMENT_OTHER): Payer: Medicare Other | Admitting: Radiology

## 2022-04-06 DIAGNOSIS — R0781 Pleurodynia: Secondary | ICD-10-CM | POA: Insufficient documentation

## 2022-04-06 DIAGNOSIS — R7989 Other specified abnormal findings of blood chemistry: Secondary | ICD-10-CM | POA: Insufficient documentation

## 2022-04-06 DIAGNOSIS — R079 Chest pain, unspecified: Secondary | ICD-10-CM | POA: Diagnosis present

## 2022-04-06 DIAGNOSIS — N189 Chronic kidney disease, unspecified: Secondary | ICD-10-CM | POA: Diagnosis not present

## 2022-04-06 LAB — BASIC METABOLIC PANEL
Anion gap: 8 (ref 5–15)
BUN: 15 mg/dL (ref 8–23)
CO2: 29 mmol/L (ref 22–32)
Calcium: 9.9 mg/dL (ref 8.9–10.3)
Chloride: 101 mmol/L (ref 98–111)
Creatinine, Ser: 0.72 mg/dL (ref 0.44–1.00)
GFR, Estimated: >60 mL/min (ref >60)
Glucose, Bld: 111 mg/dL — ABNORMAL HIGH (ref 70–99)
Potassium: 4.2 mmol/L (ref 3.5–5.1)
Sodium: 138 mmol/L (ref 135–145)

## 2022-04-06 LAB — CBC
HCT: 38.9 % (ref 36.0–46.0)
Hemoglobin: 13 g/dL (ref 12.0–15.0)
MCH: 30 pg (ref 26.0–34.0)
MCHC: 33.4 g/dL (ref 30.0–36.0)
MCV: 89.6 fL (ref 80.0–100.0)
Platelets: 278 10*3/uL (ref 150–400)
RBC: 4.34 MIL/uL (ref 3.87–5.11)
RDW: 12.5 % (ref 11.5–15.5)
WBC: 10.1 10*3/uL (ref 4.0–10.5)
nRBC: 0 % (ref 0.0–0.2)

## 2022-04-06 LAB — D-DIMER, QUANTITATIVE: D-Dimer, Quant: 0.48 ug/mL-FEU (ref 0.00–0.50)

## 2022-04-06 LAB — TROPONIN I (HIGH SENSITIVITY)
Troponin I (High Sensitivity): 17 ng/L (ref <18)
Troponin I (High Sensitivity): 23 ng/L — ABNORMAL HIGH (ref <18)
Troponin I (High Sensitivity): 17 ng/L (ref <18)

## 2022-04-06 MED ORDER — ASPIRIN 81 MG PO CHEW
243.0000 mg | CHEWABLE_TABLET | Freq: Once | ORAL | Status: AC
  Administered 2022-04-06: 243 mg via ORAL
  Filled 2022-04-06: qty 3

## 2022-04-06 MED ORDER — HEPARIN (PORCINE) 25000 UT/250ML-% IV SOLN
550.0000 [IU]/h | INTRAVENOUS | Status: DC
  Administered 2022-04-06: 550 [IU]/h via INTRAVENOUS
  Filled 2022-04-06: qty 250

## 2022-04-06 MED ORDER — NITROGLYCERIN 0.4 MG SL SUBL
0.4000 mg | SUBLINGUAL_TABLET | SUBLINGUAL | Status: DC | PRN
  Administered 2022-04-06 (×2): 0.4 mg via SUBLINGUAL
  Filled 2022-04-06: qty 1

## 2022-04-06 MED ORDER — HEPARIN BOLUS VIA INFUSION
2500.0000 [IU] | Freq: Once | INTRAVENOUS | Status: AC
  Administered 2022-04-06: 2500 [IU] via INTRAVENOUS

## 2022-04-06 MED ORDER — NAPROXEN 500 MG PO TABS: 500.0000 mg | ORAL_TABLET | Freq: Two times a day (BID) | ORAL | 0 refills | Status: DC | PRN

## 2022-04-06 MED ORDER — MORPHINE SULFATE (PF) 4 MG/ML IV SOLN
4.0000 mg | Freq: Once | INTRAVENOUS | Status: AC
  Administered 2022-04-06: 4 mg via INTRAVENOUS
  Filled 2022-04-06: qty 1

## 2022-04-06 NOTE — ED Provider Notes (Signed)
Assumed care of patient from Dr. Roxanne Mins at sign out. Patient here with left-sided pleuritic chest pain since last night worse with inspiration and deep breathing.  EKG shows no acute ischemia.  Troponin has increased from 17 and 23.  D-dimer is negative.  Awaiting hospitalist callback for admission.    Discussed with Dr. Lorin Mercy of the hospitalist service.  She does not feel strongly that patient needs to be admitted.  Her chest pain sounds atypical for ACS though troponins are minimally elevated and rising.  Dr. Lorin Mercy suggests repeat troponin and EKG to assess for stability. Recommends stopping heparin.  Long discussion with patient and husband at bedside.  They are comfortable with this plan.  D-dimer is negative.  Offered patient CT angiogram for further evaluation of her pleuritic chest pain and the low suspicion for pulmonary emboli at this time.  She is considering it.  Patient states her pain is gone except only when she breathes in.  Suspicious for pleurisy.  Discussed with her that though D-dimer is negative, small possibility remains (approximately 1 to 2%) that pulmonary embolism is still a possibility.  Discussed CT angiogram with her which she declines currently.  Repeat troponin is flat at 17.  Not consistent with ACS.  Reassuring that patient has been had ongoing pain since 10 PM but troponin remains flat.  Discussed with Dr. Gardiner Rhyme of cardiology who reviewed patient's work-up as well as new EKG that shows a flipped T wave in lead V3 which is new since 1 AM.  He agrees low suspicion for ACS with flat troponins and ongoing pain since 10 PM.  He will arrange for close outpatient follow-up.  Patient and husband comfortable with this plan.  We will treat suspected pleurisy with anti-inflammatories.  Return to the ED with exertional chest pain, pain associate with shortness of breath, nausea, vomiting, any other concerns.   Ezequiel Essex, MD 04/06/22 1002

## 2022-04-06 NOTE — ED Triage Notes (Signed)
Left chest and rib cage pain under left breast, becomes worse with respiration.  Symptoms started around 10 pm last night.

## 2022-04-06 NOTE — ED Notes (Signed)
When the morphine was given she tells me that she "only have pain when I take a deep breath or move". Monitor shows nsr and her husband is with her.

## 2022-04-06 NOTE — Discharge Instructions (Signed)
Your testing shows no evidence of heart attack with low suspicion for blood clot in the lung.  You declined CT scan today.  Take the anti-inflammatories as prescribed for possibility of pleurisy as we discussed.  You should follow-up with your PCP as well as the cardiologist for a stress test.  Return to the ED with exertional chest pain, pain associate with shortness of breath, nausea, vomiting, sweating, other concerns.

## 2022-04-06 NOTE — ED Provider Notes (Signed)
[facility] DEPT Provider Note   CSN: [id number] Arrival date & time: 04/06/22  0056     History  Chief Complaint  Patient presents with   Chest Pain    Lauren Sanchez is a 66 y.o. female.  The history is provided by the patient.  Chest Pain She has history of hyperlipidemia, chronic kidney disease, Wolff-Parkinson-White syndrome and comes in because of left-sided chest pain which started tonight.  Pain is sharp and in the left inframammary area with some radiation to the back.  Pain is worse with a deep breath.  She took a dose of aspirin 81 mg.  She denies dyspnea, nausea, diaphoresis.  Pain is subsiding, but still present.  Of note, she is currently on antiviral medication for pain in the right lateral chest which is felt to be possibly from shingles although a rash has not come up.  She is a cigarette smoker.  There is no history of hypertension, diabetes and there is no family history of premature coronary atherosclerosis.  She denies history of recent travel, active cancer, exogenous estrogen use.   Home Medications Prior to Admission medications   Medication Sig Start Date End Date Taking? Authorizing Provider  albuterol (VENTOLIN HFA) 108 (90 Base) MCG/ACT inhaler SMARTSIG:2 Puff(s) By Mouth Every 6 Hours PRN Patient not taking: Reported on 04/25/2020 11/08/19   [provider]  cholecalciferol (VITAMIN D3) 25 MCG (1000 UNIT) tablet Take 2,000 Units by mouth daily. Pt takes 2000IU daily     [provider]  Magnesium 300 MG CAPS Take by mouth.    [provider]  Omega-3 Fatty Acids (FISH OIL) 1000 MG CAPS Take by mouth daily.    [provider]  triamcinolone (KENALOG) 0.1 % Apply topically 2 (two) times daily as needed. Patient not taking: Reported on 05/09/2020 01/31/20   [provider]  UNABLE TO FIND Cholestepure- Phytosterol Complex    [provider]  valACYclovir (VALTREX) 1000 MG tablet Take by  mouth. Patient not taking: Reported on 04/25/2020 03/19/19   [provider]      Allergies    Doxycycline, Gluten meal, Hibiclens [chlorhexidine gluconate], Neomycin-polymyxin-dexameth, Rapaflo [silodosin], Simvastatin, and Tamsulosin    Review of Systems   Review of Systems  Cardiovascular:  Positive for chest pain.  All other systems reviewed and are negative.   Physical Exam Updated Vital Signs BP (!) 150/88 (BP Location: Right Arm)   Pulse 78   Temp (!) 97.5 F (36.4 C) (Oral)   Resp 20   Ht '5\' 1"'$  (1.549 m)   Wt 45.4 kg   SpO2 100%   BMI 18.89 kg/m  Physical Exam Vitals and nursing note reviewed.   66 year old female, resting comfortably and in no acute distress. Vital signs are significant for elevated blood pressure. Oxygen saturation is 100%, which is normal. Head is normocephalic and atraumatic. PERRLA, EOMI. Oropharynx is clear. Neck is nontender and supple without adenopathy or JVD. Back is nontender and there is no CVA tenderness. Lungs are clear without rales, wheezes, or rhonchi. Chest is nontender. Heart has regular rate and rhythm without murmur. Abdomen is soft, flat, nontender. Extremities have no cyanosis or edema, full range of motion is present. Skin is warm and dry without rash. Neurologic: Mental status is normal, cranial nerves are intact, moves all extremities equally.  ED Results / Procedures / Treatments   Labs (all labs ordered are listed, but only abnormal results are displayed) Labs Reviewed  BASIC METABOLIC  PANEL - Abnormal; Notable for the following components:      Result Value   Glucose, Bld 111 (*)    All other components within normal limits  TROPONIN I (HIGH SENSITIVITY) - Abnormal; Notable for the following components:   Troponin I (High Sensitivity) 23 (*)    All other components within normal limits  CBC  TROPONIN I (HIGH SENSITIVITY)    EKG EKG Interpretation  Date/Time:  Saturday April 06 2022 01:03:05  EDT Ventricular Rate:  76 PR Interval:  150 QRS Duration: 78 QT Interval:  388 QTC Calculation: 436 R Axis:   88 Text Interpretation: Normal sinus rhythm Nonspecific ST and T wave abnormality Abnormal ECG When compared with ECG of 21-Feb-2016 10:33, Wolff-Parkinson-White is no longer Present Confirmed by Delora Fuel ([id number]) on 04/06/2022 1:08:47 AM  Radiology DG Chest 2 View  Result Date: 04/06/2022 CLINICAL DATA:  Left-sided chest pain. EXAM: CHEST - 2 VIEW COMPARISON:  None Available. FINDINGS: The heart size and mediastinal contours are within normal limits. The lungs are hyperinflated. Mild atelectatic changes are noted within the bilateral lung bases. There is no evidence of focal consolidation, pleural effusion or pneumothorax. The visualized skeletal structures are unremarkable. IMPRESSION: Mild bibasilar atelectasis. Electronically Signed   By: Virgina Norfolk M.D.   On: 04/06/2022 02:09    Procedures Procedures  Cardiac monitor shows normal sinus rhythm, per my interpretation.  Medications Ordered in ED Medications  aspirin chewable tablet 243 mg (has no administration in time range)  nitroGLYCERIN (NITROSTAT) SL tablet 0.4 mg (has no administration in time range)  heparin bolus via infusion 2,500 Units (has no administration in time range)  heparin ADULT infusion 100 units/mL (25000 units/250m) (has no administration in time range)    ED Course/ Medical Decision Making/ A&P                           Medical Decision Making Amount and/or Complexity of Data Reviewed Labs: ordered. Radiology: ordered.  Risk OTC drugs. Prescription drug management.   Pleuritic chest pain.  Differential diagnosis includes, but is not limited to, ACS, pulmonary embolism, viral pleurisy, GERD, pneumonia.  I have reviewed and interpreted her electrocardiogram, and my interpretation is nonspecific ST and T changes which had been present on a prior ECG, prior ECG showed delta wave of  Wolff-Parkinson-White syndrome which is not present today.  Chest x-ray showed bibasilar atelectasis.  I have independently viewed the images, and agree with the radiologist's interpretation.  I have reviewed and interpreted her laboratory tests, and my interpretation is mild elevation of random glucose, normal initial troponin was slightly elevated delta troponin.  Although she has no risk factors for pulmonary embolism, with slightly elevated troponin in the setting of pleuritic chest pain, I do feel it is important to rule out pulmonary embolism so I have ordered a D-dimer.  I have ordered additional aspirin for her.  With elevated troponin, the differential diagnosis now comes down to pulmonary embolism versus non-STEMI.  Both need treatment with heparin, so I have ordered heparin infusion.  I have also ordered a therapeutic trial of nitroglycerin.  Heart score is 5, which puts her at moderate risk for major adverse cardiac events in the next 6 weeks.  D-dimer has come back normal, no indication for CT angiogram of the chest.  She had partial relief of pain with nitroglycerin, I have ordered intravenous morphine.  I have put in a request for consultation with hospitalist  for hospital admission at [place] Performed by: Delora Fuel Total critical care time: 45 minutes Critical care time was exclusive of separately billable procedures and treating other patients. Critical care was necessary to treat or prevent imminent or life-threatening deterioration. Critical care was time spent personally by me on the following activities: development of treatment plan with patient and/or surrogate as well as nursing, discussions with consultants, evaluation of patient's response to treatment, examination of patient, obtaining history from patient or surrogate, ordering and performing treatments and interventions, ordering and review of laboratory studies, ordering and review of radiographic  studies, pulse oximetry and re-evaluation of patient's condition.  Final Clinical Impression(s) / ED Diagnoses Final diagnoses:  Pleuritic chest pain  Elevated troponin    Rx / DC Orders ED Discharge Orders     None         Delora Fuel, MD 33/82/50 [phone]

## 2022-04-06 NOTE — ED Notes (Signed)
She remains pain-free when lying still. Dr. Wyvonnia Dusky has just informed her that the plan is to d/c, with which she and her husband agree.

## 2022-04-06 NOTE — Progress Notes (Signed)
Plan of Care Note for deferred transfer   Patient: Lauren Sanchez MRN: [id number]   DOA: 04/06/2022  Facility requesting transfer: [facility] Requesting Provider: [facility] Reason for transfer: chest pain  Facility course: Patient with h/o nephrolithiasis, HLD, and WPW presenting with pleuritic chest pain.  Pain worse since 10pm.  EKG with T wave flattening.  Troponin 17, 23.  D-dimer negative.  CXR with atelectasis.  Started on heparin overnight - I have recommended that this be stopped since this is a negative delta.  Recommend repeat troponin and if not significantly elevated can consult cardiology.  Deferred for now, patient may be stable for dc to home after repeat troponin.  Patient is deferred at this time.  Author: Karmen Bongo, MD 04/06/2022  Check [email or web address] for on-call coverage.  Nursing staff, Please call [place] number on Amion as soon as patient's arrival, so appropriate admitting provider can evaluate the pt.

## 2022-04-06 NOTE — Progress Notes (Signed)
ANTICOAGULATION CONSULT NOTE - Initial Consult  Pharmacy Consult for Heparin Indication: chest pain/ACS  Allergies  Allergen Reactions   Doxycycline Nausea Only   Gluten Meal Other (See Comments)    Intolerance to gluten  Can not eat    Hibiclens [Chlorhexidine Gluconate] Other (See Comments)    Burns skin    Neomycin-Polymyxin-Dexameth Swelling    Severe rash on eyelids with swelling    Rapaflo [Silodosin]     CAUSED DROP IN BLOOD PRESSURE AND PT FELT REALLY STRANGE   Simvastatin Other (See Comments)    Joint pain and memory changes.    Tamsulosin     CAUSED LOW BLOOD PRESSURE AND PT FAINTED    Patient Measurements: Height: '5\' 1"'$  (154.9 cm) Weight: 45.4 kg (100 lb) IBW/kg (Calculated) : 47.8  Vital Signs: Temp: 97.5 F (36.4 C) (11/04 0106) Temp Source: Oral (11/04 0106) BP: 150/88 (11/04 0106) Pulse Rate: 78 (11/04 0106)  Labs: Recent Labs    04/06/22 0129 04/06/22 0425  HGB 13.0  --   HCT 38.9  --   PLT 278  --   CREATININE 0.72  --   TROPONINIHS 17 23*    Estimated Creatinine Clearance: 48.9 mL/min (by C-G formula based on SCr of 0.72 mg/dL).   Medical History: Past Medical History:  Diagnosis Date   Allergy    very mild with pollen , itchy eyes    Arthritis    slight in fingers    Chronic kidney disease    kidney stones   Complication of anesthesia    with surgery in 12/2015 - felt draggy for several days    Family history of anesthesia complication    PT STATES HER MOTHER IS 90 YRS OLD - BUT HAD SOME PROBLEM IN HER 20'S OR 30'S WHEN PUT UNDER FOR ANESTHESIA .  PT PLANS TO CALL HER MOTHER AND ASK HER IF SHE HAS MORE DETAILS.   History of kidney stones 03/17/2013   STATES KIDNEY STONE FOR PAST 2 MONTHS - CAUSING A LOT OF PAIN AND N&V-UNABLE TO PASS STONE.  PREVIOUS HX OF STONES   Hx of adenomatous polyp of colon    Hyperlipidemia    IBS (irritable bowel syndrome)    Gluten Free currently 04-25-2020  - IBS in her 20's    Kidney stone    Kidney  stones    Thyroid disease    nodule-gluten free diet and thyroid blood studies w/in normal past 8 monts- is followed by endocrinologist   Thyroid nodule    followed by endocrinologist, stable per patient    Thyroid nodule    neg for cancer per pt    WPW (Wolff-Parkinson-White syndrome)    DOCUMENTED ON EKG'S DATING BACK TO 2011--PT SAW CARDIOLOGIST DR. ALLRED IN 2011 - HAD STRESS TEST "NONDIAGNOSTIC FOR ISCHEMIA DUE TO PRE-EXCITATION"  "NO ARRHYTHMIAS DURING EXERCISE".  PT STATES  NOT AWARE OF ANY PROBLEMS FROM WPW - HER LAST EKG WAS 08/25/12 WITH DR. Etter Sanchez SHOWING WPW SYNDROME    Medications:  No current facility-administered medications on file prior to encounter.   Current Outpatient Medications on File Prior to Encounter  Medication Sig Dispense Refill   albuterol (VENTOLIN HFA) 108 (90 Base) MCG/ACT inhaler SMARTSIG:2 Puff(s) By Mouth Every 6 Hours PRN (Patient not taking: Reported on 04/25/2020)     cholecalciferol (VITAMIN D3) 25 MCG (1000 UNIT) tablet Take 2,000 Units by mouth daily. Pt takes 2000IU daily      Magnesium 300 MG CAPS Take by mouth.  Omega-3 Fatty Acids (FISH OIL) 1000 MG CAPS Take by mouth daily.     triamcinolone (KENALOG) 0.1 % Apply topically 2 (two) times daily as needed. (Patient not taking: Reported on 05/09/2020)     UNABLE TO FIND Cholestepure- Phytosterol Complex     valACYclovir (VALTREX) 1000 MG tablet Take by mouth. (Patient not taking: Reported on 04/25/2020)       Assessment: 66 y.o. female with chest pain for heparin  Goal of Therapy:  Heparin level 0.3-0.7 units/ml Monitor platelets by anticoagulation protocol: Yes   Plan:  Heparin 2500 units IV bolus, then start heparin 550 units/hr Check heparin level in 6 hours.   Lauren Sanchez 04/06/2022,5:33 AM

## 2022-04-10 NOTE — Progress Notes (Unsigned)
Cardiology Office Note:    Date:  04/11/2022   ID:  Jaya Lapka, DOB 04-Dec-1955, MRN [id number]  PCP:  Manfred Shirts, PA  Cardiologist:  None  Electrophysiologist:  None   Referring MD: Manfred Shirts, PA   Chief Complaint  Patient presents with   Chest Pain    History of Present Illness:    Miliana Gangwer is a 66 y.o. female with a hx of WPW, hyperlipidemia who presents as an ED follow-up for chest pain.  She was seen in the ED on 04/06/2022 with chest pain.  Troponin 17 > 23 > 17, D-dimer negative.  She saw Dr. Rayann Heman in 11/23/2009 for preexcitation seen on EKG.  No evidence of SVT.  Recommended GXT to evaluate conduction properties of her accessory pathway.  GXT 01/2010 was nondiagnostic for ischemia due to preexcitation, no arrhythmias during exercise.  She reports she had an episode this past summer where felt like heart was racing.  Lasted about 2 minutes.  Denies any recent palpitations, lightheadedness, or syncope.  She reports she was recently diagnosed with likely shingles and started on Valtrex.  Was having pain on right side of back.  When she presented to the ED on 04/06/2022 she had began having pain in left lower chest.  States that pain was constant and stabbing, worse with deep breathing.  Lasted for hours.  Does continue to have some pain since discharge from the ED but improving.  She walks daily for about 30 minutes, denies any exertional symptoms.  She smokes 3 to 4 cigarettes/day, was smoking up to 0.5 packs/day, and has smoked since her 100s.  Family history includes both parents had pacemakers but she is unsure of details.   Past Medical History:  Diagnosis Date   Allergy    very mild with pollen , itchy eyes    Arthritis    slight in fingers    Complication of anesthesia    with surgery in 12/2015 - felt draggy for several days    Family history of anesthesia complication    PT STATES HER MOTHER IS 90 YRS OLD - BUT HAD SOME PROBLEM IN HER 20'S OR 30'S WHEN PUT UNDER  FOR ANESTHESIA .  PT PLANS TO CALL HER MOTHER AND ASK HER IF SHE HAS MORE DETAILS.   History of kidney stones 03/17/2013   STATES KIDNEY STONE FOR PAST 2 MONTHS - CAUSING A LOT OF PAIN AND N&V-UNABLE TO PASS STONE.  PREVIOUS HX OF STONES   Hx of adenomatous polyp of colon    Hyperlipidemia    IBS (irritable bowel syndrome)    Gluten Free currently 04-25-2020  - IBS in her 20's    Thyroid disease    nodule-gluten free diet and thyroid blood studies w/in normal past 8 monts- is followed by endocrinologist   WPW (Wolff-Parkinson-White syndrome)    DOCUMENTED ON EKG'S DATING BACK TO 2011--PT SAW CARDIOLOGIST DR. ALLRED IN 2011 - HAD STRESS TEST "NONDIAGNOSTIC FOR ISCHEMIA DUE TO PRE-EXCITATION"  "NO ARRHYTHMIAS DURING EXERCISE".  PT STATES  NOT AWARE OF ANY PROBLEMS FROM WPW - HER LAST EKG WAS 08/25/12 WITH DR. Etter Sjogren SHOWING WPW SYNDROME    Past Surgical History:  Procedure Laterality Date   COLONOSCOPY     CYSTOSCOPY WITH RETROGRADE PYELOGRAM, URETEROSCOPY AND STENT PLACEMENT Left 02/23/2016   Procedure: CYSTOSCOPY WITH LEFT RETROGRADE PYELOGRAM, LEFT URETEROSCOPY  AND LEFT STENT PLACEMENT;  Surgeon: Ardis Hughs, MD;  Location: [facility];  Service: Urology;  Laterality: Left;  CYSTOSCOPY WITH RETROGRADE PYELOGRAM, URETEROSCOPY AND STENT PLACEMENT Left 03/23/2020   Procedure: CYSTOSCOPY WITH RETROGRADE PYELOGRAM, LEFT URETEROSCOPY HOLMIUM LASER AND STENT PLACEMENT;  Surgeon: Ardis Hughs, MD;  Location: [facility];  Service: Urology;  Laterality: Left;   CYSTOSCOPY WITH URETEROSCOPY, STONE BASKETRY AND STENT PLACEMENT Right 03/18/2013   Procedure: RIGHT URETEROSCOPY,  STONE REMOVAL AND STENT PLACEMENT, retrograde;  Surgeon: Ardis Hughs, MD;  Location: [facility];  Service: Urology;  Laterality: Right;   DILATION AND CURETTAGE OF UTERUS     x 2   HEMORRHOID BANDING     Dr Rosana Hoes- done in [place] office   HYSTEROSCOPY     12/2015- in epic under Care Everywhere      Current Medications: Current Meds  Medication Sig   albuterol (VENTOLIN HFA) 108 (90 Base) MCG/ACT inhaler SMARTSIG:2 Puff(s) By Mouth Every 6 Hours PRN   cholecalciferol (VITAMIN D3) 25 MCG (1000 UNIT) tablet Take 2,000 Units by mouth daily. Pt takes 2000IU daily    Magnesium 300 MG CAPS Take by mouth.   naproxen (NAPROSYN) 500 MG tablet Take 1 tablet (500 mg total) by mouth 2 (two) times daily as needed.   Omega-3 Fatty Acids (FISH OIL) 1000 MG CAPS Take by mouth daily.   triamcinolone (KENALOG) 0.1 % Apply topically 2 (two) times daily as needed.   UNABLE TO FIND Cholestepure- Phytosterol Complex   valACYclovir (VALTREX) 1000 MG tablet Take by mouth.     Allergies:   Doxycycline, Gluten meal, Hibiclens [chlorhexidine gluconate], Neomycin-polymyxin-dexameth, Rapaflo [silodosin], Simvastatin, and Tamsulosin   Social History   Socioeconomic History   Marital status: Married    Spouse name: Not on file   Number of children: Not on file   Years of education: Not on file   Highest education level: Not on file  Occupational History   Not on file  Tobacco Use   Smoking status: Some Days    Packs/day: 0.25    Years: 30.00    Total pack years: 7.50    Types: Cigarettes   Smokeless tobacco: Never  Vaping Use   Vaping Use: Never used  Substance and Sexual Activity   Alcohol use: Never   Drug use: No   Sexual activity: Yes  Other Topics Concern   Not on file  Social History Narrative   Not on file   Social Determinants of Health   Financial Resource Strain: Not on file  Food Insecurity: Not on file  Transportation Needs: Not on file  Physical Activity: Not on file  Stress: Not on file  Social Connections: Not on file     Family History: The patient's family history includes Arthritis in her mother; Cataracts in her father; Colon cancer in her maternal grandmother; Colon polyps in her mother; Dementia in an other family member; Diabetes in her father; Diverticulitis  in her father; Endometriosis in her sister; Glaucoma in her father; Heart disease in her father and mother; Heart murmur in her mother; Hypertension in her father; Lung cancer in an other family member; Macular degeneration in her mother; Osteopenia in her mother; Skin cancer in her father; Thyroid nodules in her sister. There is no history of Kidney disease, Liver disease, Esophageal cancer, Rectal cancer, or Stomach cancer.  ROS:   Please see the history of present illness.     All other systems reviewed and are negative.  EKGs/Labs/Other Studies Reviewed:    The following studies were reviewed today:   EKG:   04/11/2022: Normal  sinus rhythm, rate 66, QRS 76 no evidence of preexcitation, QTc 402  Recent Labs: 04/06/2022: BUN 15; Creatinine, Ser 0.72; Hemoglobin 13.0; Platelets 278; Potassium 4.2; Sodium 138  Recent Lipid Panel    Component Value Date/Time   CHOL 261 (H) 12/02/2012 0921   TRIG 106 12/02/2012 0921   HDL 71 12/02/2012 0921   CHOLHDL 3 07/08/2011 1014   VLDL 19.6 07/08/2011 1014   LDLCALC 169 (H) 12/02/2012 0921   LDLDIRECT 137.9 03/09/2010 1029    Physical Exam:    VS:  BP 106/78 (BP Location: Left Arm, Patient Position: Sitting, Cuff Size: Normal)   Pulse 75   Ht 5' 1" (1.549 m)   Wt 103 lb 9.6 oz (47 kg)   SpO2 98%   BMI 19.58 kg/m     Wt Readings from Last 3 Encounters:  04/11/22 103 lb 9.6 oz (47 kg)  04/06/22 100 lb (45.4 kg)  05/09/20 103 lb (46.7 kg)     GEN:  Well nourished, well developed in no acute distress HEENT: Normal NECK: No JVD; No carotid bruits LYMPHATICS: No lymphadenopathy CARDIAC: RRR, no murmurs, rubs, gallops RESPIRATORY:  Clear to auscultation without rales, wheezing or rhonchi  ABDOMEN: Soft, non-tender, non-distended MUSCULOSKELETAL:  No edema; No deformity  SKIN: Warm and dry NEUROLOGIC:  Alert and oriented x 3 PSYCHIATRIC:  Normal affect   ASSESSMENT:    1. Chest pain of uncertain etiology   2. Palpitations   3.  Hyperlipidemia, unspecified hyperlipidemia type   4. WPW (Wolff-Parkinson-White syndrome)    PLAN:    Chest pain: Atypical in description but does have significant CAD risk factors (age, hyperlipidemia, tobacco use).  She has upcoming surgery for kidney stones -Recommend exercise Myoview to rule out ischemia -Echocardiogram to evaluate for structural heart disease -Check ESR/CRP  WPW: She saw Dr. Rayann Heman in 11/23/2009 for preexcitation seen on EKG.  No evidence of SVT.  Recommended GXT to evaluate conduction properties of her accessory pathway.  GXT 01/2010 was nondiagnostic for ischemia due to preexcitation, no arrhythmias during exercise. -EKG today shows no preexcitation.  She does report episode of palpitations where folic heart was racing.  Check Zio patch x2 weeks  HLD: LDL 159 on 04/09/2022.  She reports intolerance to multiple statins.  Check calcium score.  If calcium score elevated will refer to pharmacy lipid clinic to evaluate for PCSK9 inhibitor  RTC in 3 months  Shared Decision Making/Informed Consent The risks [chest pain, shortness of breath, cardiac arrhythmias, dizziness, blood pressure fluctuations, myocardial infarction, stroke/transient ischemic attack, nausea, vomiting, allergic reaction, radiation exposure, metallic taste sensation and life-threatening complications (estimated to be 1 in 10,000)], benefits (risk stratification, diagnosing coronary artery disease, treatment guidance) and alternatives of a nuclear stress test were discussed in detail with Ms. Hefel and she agrees to proceed.    Medication Adjustments/Labs and Tests Ordered: Current medicines are reviewed at length with the patient today.  Concerns regarding medicines are outlined above.  Orders Placed This Encounter  Procedures   CT CARDIAC SCORING (SELF PAY ONLY)   Sed Rate (ESR)   C-reactive protein   LONG TERM MONITOR (3-14 DAYS)   MYOCARDIAL PERFUSION IMAGING   EKG 12-Lead   ECHOCARDIOGRAM COMPLETE    No orders of the defined types were placed in this encounter.   Patient Instructions  Medication Instructions:  Your physician recommends that you continue on your current medications as directed. Please refer to the Current Medication list given to you today.  *If you need  a refill on your cardiac medications before your next appointment, please call your pharmacy*   Lab Work: ESR, CRP today  If you have labs (blood work) drawn today and your tests are completely normal, you will receive your results only by: [place] (if you have MyChart) OR A paper copy in the mail If you have any lab test that is abnormal or we need to change your treatment, we will call you to review the results.   Testing/Procedures: Your physician has requested that you have an exercise stress myoview. For further information please visit [email or web address]. Please follow instruction sheet, as given.  Your physician has requested that you have an echocardiogram. Echocardiography is a painless test that uses sound waves to create images of your heart. It provides your doctor with information about the size and shape of your heart and how well your heart's chambers and valves are working. This procedure takes approximately one hour. There are no restrictions for this procedure. Please do NOT wear cologne, perfume, aftershave, or lotions (deodorant is allowed). Please arrive 15 minutes prior to your appointment time.  CT coronary calcium score.   Test locations:  [place]   This is $99 out of pocket.   Coronary CalciumScan A coronary calcium scan is an imaging test used to look for deposits of calcium and other fatty materials (plaques) in the inner lining of the blood vessels of the heart (coronary arteries). These deposits of calcium and plaques can partly clog and narrow the coronary arteries without producing any symptoms or warning signs. This puts a person at  risk for a heart attack. This test can detect these deposits before symptoms develop. Tell a health care provider about: Any allergies you have. All medicines you are taking, including vitamins, herbs, eye drops, creams, and over-the-counter medicines. Any problems you or family members have had with anesthetic medicines. Any blood disorders you have. Any surgeries you have had. Any medical conditions you have. Whether you are pregnant or may be pregnant. What are the risks? Generally, this is a safe procedure. However, problems may occur, including: Harm to a pregnant woman and her unborn baby. This test involves the use of radiation. Radiation exposure can be dangerous to a pregnant woman and her unborn baby. If you are pregnant, you generally should not have this procedure done. Slight increase in the risk of cancer. This is because of the radiation involved in the test. What happens before the procedure? No preparation is needed for this procedure. What happens during the procedure? You will undress and remove any jewelry around your neck or chest. You will put on a hospital gown. Sticky electrodes will be placed on your chest. The electrodes will be connected to an electrocardiogram (ECG) machine to record a tracing of the electrical activity of your heart. A CT scanner will take pictures of your heart. During this time, you will be asked to lie still and hold your breath for 2-3 seconds while a picture of your heart is being taken. The procedure may vary among health care providers and hospitals. What happens after the procedure? You can get dressed. You can return to your normal activities. It is up to you to get the results of your test. Ask your health care provider, or the department that is doing the test, when your results will be ready. Summary A coronary calcium scan is an imaging test used to look for deposits of calcium and other fatty materials (plaques) in  the inner lining  of the blood vessels of the heart (coronary arteries). Generally, this is a safe procedure. Tell your health care provider if you are pregnant or may be pregnant. No preparation is needed for this procedure. A CT scanner will take pictures of your heart. You can return to your normal activities after the scan is done. This information is not intended to replace advice given to you by your health care provider. Make sure you discuss any questions you have with your health care provider. Document Released: 11/16/2007 Document Revised: 04/08/2016 Document Reviewed: 04/08/2016 Elsevier Interactive Patient Education  2017 [place] Term Monitor Instructions   Your physician has requested you wear a ZIO patch monitor for _14__ days.  This is a single patch monitor.   [employer] supplies one patch monitor per enrollment. Additional stickers are not available. Please do not apply patch if you will be having a Nuclear Stress Test, Echocardiogram, Cardiac CT, MRI, or Chest Xray during the period you would be wearing the monitor. The patch cannot be worn during these tests. You cannot remove and re-apply the ZIO XT patch monitor.  Your ZIO patch monitor will be sent Fed Ex from [employer] directly to your home address. It may take 3-5 days to receive your monitor after you have been enrolled.  Once you have received your monitor, please review the enclosed instructions. Your monitor has already been registered assigning a specific monitor serial # to you.  Billing and Patient Assistance Program Information   We have supplied [employer] with any of your insurance information on file for billing purposes. [employer] offers a sliding scale Patient Assistance Program for patients that do not have insurance, or whose insurance does not completely cover the cost of the ZIO monitor.   You must apply for the Patient Assistance Program to qualify for this discounted rate.     To apply, please  call [employer] at [phone], select option 4, then select option 2, and ask to apply for Patient Assistance Program.  Theodore Demark will ask your household income, and how many people are in your household.  They will quote your out-of-pocket cost based on that information.  [employer] will also be able to set up a 23-month interest-free payment plan if needed.  Applying the monitor   Shave hair from upper left chest.  Hold abrader disc by orange tab. Rub abrader in 40 strokes over the upper left chest as indicated in your monitor instructions.  Clean area with 4 enclosed alcohol pads. Let dry.  Apply patch as indicated in monitor instructions. Patch will be placed under collarbone on left side of chest with arrow pointing upward.  Rub patch adhesive wings for 2 minutes. Remove white label marked "1". Remove the white label marked "2". Rub patch adhesive wings for 2 additional minutes.  While looking in a mirror, press and release button in center of patch. A small green light will flash 3-4 times. This will be your only indicator that the monitor has been turned on. ?  Do not shower for the first 24 hours. You may shower after the first 24 hours.  Press the button if you feel a symptom. You will hear a small click. Record Date, Time and Symptom in the Patient Logbook.  When you are ready to remove the patch, follow instructions on the last 2 pages of the Patient Logbook. Stick patch monitor onto the last page of Patient Logbook.  Place Patient Logbook in  the blue and white box.  Use locking tab on box and tape box closed securely.  The blue and white box has prepaid postage on it. Please place it in the mailbox as soon as possible. Your physician should have your test results approximately 7 days after the monitor has been mailed back to [facility]  Call [place] at [phone] if you have questions regarding your ZIO XT patch monitor. Call them immediately if you see an orange  light blinking on your monitor.  If your monitor falls off in less than 4 days, contact our Monitor department at [phone]. ?If your monitor becomes loose or falls off after 4 days call [employer] at [phone] for suggestions on securing your monitor.?    Follow-Up: At [facility], you and your health needs are our priority.  As part of our continuing mission to provide you with exceptional heart care, we have created designated Provider Care Teams.  These Care Teams include your primary Cardiologist (physician) and Advanced Practice Providers (APPs -  Physician Assistants and Nurse Practitioners) who all work together to provide you with the care you need, when you need it.  We recommend signing up for the patient portal called "MyChart".  Sign up information is provided on this After Visit Summary.  MyChart is used to connect with patients for Virtual Visits (Telemedicine).  Patients are able to view lab/test results, encounter notes, upcoming appointments, etc.  Non-urgent messages can be sent to your provider as well.   To learn more about what you can do with MyChart, go to [email or web address].    Your next appointment:   3 month(s)  The format for your next appointment:   In Person  Provider:   Dr. Gardiner Rhyme           Signed, Donato Heinz, MD  04/11/2022 5:16 PM    [place]

## 2022-04-11 ENCOUNTER — Encounter: Payer: Self-pay | Admitting: Cardiology

## 2022-04-11 ENCOUNTER — Ambulatory Visit: Payer: Medicare Other | Attending: Cardiology | Admitting: Cardiology

## 2022-04-11 ENCOUNTER — Ambulatory Visit (INDEPENDENT_AMBULATORY_CARE_PROVIDER_SITE_OTHER): Payer: Medicare Other

## 2022-04-11 VITALS — BP 106/78 | HR 75 | Ht 61.0 in | Wt 103.6 lb

## 2022-04-11 DIAGNOSIS — R079 Chest pain, unspecified: Secondary | ICD-10-CM | POA: Diagnosis not present

## 2022-04-11 DIAGNOSIS — R002 Palpitations: Secondary | ICD-10-CM

## 2022-04-11 DIAGNOSIS — I456 Pre-excitation syndrome: Secondary | ICD-10-CM | POA: Diagnosis not present

## 2022-04-11 DIAGNOSIS — E785 Hyperlipidemia, unspecified: Secondary | ICD-10-CM | POA: Diagnosis not present

## 2022-04-11 NOTE — Progress Notes (Unsigned)
Enrolled for [employer] to mail a ZIO XT long term holter monitor to the patients address on file.  

## 2022-04-11 NOTE — Patient Instructions (Signed)
Medication Instructions:  Your physician recommends that you continue on your current medications as directed. Please refer to the Current Medication list given to you today.  *If you need a refill on your cardiac medications before your next appointment, please call your pharmacy*   Lab Work: ESR, CRP today  If you have labs (blood work) drawn today and your tests are completely normal, you will receive your results only by: [place] (if you have MyChart) OR A paper copy in the mail If you have any lab test that is abnormal or we need to change your treatment, we will call you to review the results.   Testing/Procedures: Your physician has requested that you have an exercise stress myoview. For further information please visit [email or web address]. Please follow instruction sheet, as given.  Your physician has requested that you have an echocardiogram. Echocardiography is a painless test that uses sound waves to create images of your heart. It provides your doctor with information about the size and shape of your heart and how well your heart's chambers and valves are working. This procedure takes approximately one hour. There are no restrictions for this procedure. Please do NOT wear cologne, perfume, aftershave, or lotions (deodorant is allowed). Please arrive 15 minutes prior to your appointment time.  CT coronary calcium score.   Test locations:  [place]   This is $99 out of pocket.   Coronary CalciumScan A coronary calcium scan is an imaging test used to look for deposits of calcium and other fatty materials (plaques) in the inner lining of the blood vessels of the heart (coronary arteries). These deposits of calcium and plaques can partly clog and narrow the coronary arteries without producing any symptoms or warning signs. This puts a person at risk for a heart attack. This test can detect these deposits before symptoms develop. Tell a  health care provider about: Any allergies you have. All medicines you are taking, including vitamins, herbs, eye drops, creams, and over-the-counter medicines. Any problems you or family members have had with anesthetic medicines. Any blood disorders you have. Any surgeries you have had. Any medical conditions you have. Whether you are pregnant or may be pregnant. What are the risks? Generally, this is a safe procedure. However, problems may occur, including: Harm to a pregnant woman and her unborn baby. This test involves the use of radiation. Radiation exposure can be dangerous to a pregnant woman and her unborn baby. If you are pregnant, you generally should not have this procedure done. Slight increase in the risk of cancer. This is because of the radiation involved in the test. What happens before the procedure? No preparation is needed for this procedure. What happens during the procedure? You will undress and remove any jewelry around your neck or chest. You will put on a hospital gown. Sticky electrodes will be placed on your chest. The electrodes will be connected to an electrocardiogram (ECG) machine to record a tracing of the electrical activity of your heart. A CT scanner will take pictures of your heart. During this time, you will be asked to lie still and hold your breath for 2-3 seconds while a picture of your heart is being taken. The procedure may vary among health care providers and hospitals. What happens after the procedure? You can get dressed. You can return to your normal activities. It is up to you to get the results of your test. Ask your health care provider, or the department that is doing  the test, when your results will be ready. Summary A coronary calcium scan is an imaging test used to look for deposits of calcium and other fatty materials (plaques) in the inner lining of the blood vessels of the heart (coronary arteries). Generally, this is a safe procedure.  Tell your health care provider if you are pregnant or may be pregnant. No preparation is needed for this procedure. A CT scanner will take pictures of your heart. You can return to your normal activities after the scan is done. This information is not intended to replace advice given to you by your health care provider. Make sure you discuss any questions you have with your health care provider. Document Released: 11/16/2007 Document Revised: 04/08/2016 Document Reviewed: 04/08/2016 Elsevier Interactive Patient Education  2017 [employer] Monitor Instructions   Your physician has requested you wear a ZIO patch monitor for _14__ days.  This is a single patch monitor.   [employer] supplies one patch monitor per enrollment. Additional stickers are not available. Please do not apply patch if you will be having a Nuclear Stress Test, Echocardiogram, Cardiac CT, MRI, or Chest Xray during the period you would be wearing the monitor. The patch cannot be worn during these tests. You cannot remove and re-apply the ZIO XT patch monitor.  Your ZIO patch monitor will be sent Fed Ex from [employer] directly to your home address. It may take 3-5 days to receive your monitor after you have been enrolled.  Once you have received your monitor, please review the enclosed instructions. Your monitor has already been registered assigning a specific monitor serial # to you.  Billing and Patient Assistance Program Information   We have supplied [employer] with any of your insurance information on file for billing purposes. [employer] offers a sliding scale Patient Assistance Program for patients that do not have insurance, or whose insurance does not completely cover the cost of the ZIO monitor.   You must apply for the Patient Assistance Program to qualify for this discounted rate.     To apply, please call [employer] at [phone], select option 4, then select option 2, and ask to apply for  Patient Assistance Program.  Theodore Demark will ask your household income, and how many people are in your household.  They will quote your out-of-pocket cost based on that information.  [employer] will also be able to set up a 56-month interest-free payment plan if needed.  Applying the monitor   Shave hair from upper left chest.  Hold abrader disc by orange tab. Rub abrader in 40 strokes over the upper left chest as indicated in your monitor instructions.  Clean area with 4 enclosed alcohol pads. Let dry.  Apply patch as indicated in monitor instructions. Patch will be placed under collarbone on left side of chest with arrow pointing upward.  Rub patch adhesive wings for 2 minutes. Remove white label marked "1". Remove the white label marked "2". Rub patch adhesive wings for 2 additional minutes.  While looking in a mirror, press and release button in center of patch. A small green light will flash 3-4 times. This will be your only indicator that the monitor has been turned on. ?  Do not shower for the first 24 hours. You may shower after the first 24 hours.  Press the button if you feel a symptom. You will hear a small click. Record Date, Time and Symptom in the Patient Logbook.  When you are ready  to remove the patch, follow instructions on the last 2 pages of the Patient Logbook. Stick patch monitor onto the last page of Patient Logbook.  Place Patient Logbook in the blue and white box.  Use locking tab on box and tape box closed securely.  The blue and white box has prepaid postage on it. Please place it in the mailbox as soon as possible. Your physician should have your test results approximately 7 days after the monitor has been mailed back to [facility].  Call [place] at [phone] if you have questions regarding your ZIO XT patch monitor. Call them immediately if you see an orange light blinking on your monitor.  If your monitor falls off in less than 4 days, contact our  Monitor department at [phone]. ?If your monitor becomes loose or falls off after 4 days call [employer] at [phone] for suggestions on securing your monitor.?    Follow-Up: At [facility], you and your health needs are our priority.  As part of our continuing mission to provide you with exceptional heart care, we have created designated Provider Care Teams.  These Care Teams include your primary Cardiologist (physician) and Advanced Practice Providers (APPs -  Physician Assistants and Nurse Practitioners) who all work together to provide you with the care you need, when you need it.  We recommend signing up for the patient portal called "MyChart".  Sign up information is provided on this After Visit Summary.  MyChart is used to connect with patients for Virtual Visits (Telemedicine).  Patients are able to view lab/test results, encounter notes, upcoming appointments, etc.  Non-urgent messages can be sent to your provider as well.   To learn more about what you can do with MyChart, go to [email or web address].    Your next appointment:   3 month(s)  The format for your next appointment:   In Person  Provider:   Dr. Gardiner Rhyme

## 2022-04-12 LAB — SEDIMENTATION RATE: Sed Rate: 10 mm/hr (ref 0–40)

## 2022-04-12 LAB — C-REACTIVE PROTEIN: CRP: 12 mg/L — ABNORMAL HIGH (ref 0–10)

## 2022-04-15 ENCOUNTER — Telehealth: Payer: Self-pay | Admitting: Cardiology

## 2022-04-15 DIAGNOSIS — R079 Chest pain, unspecified: Secondary | ICD-10-CM

## 2022-04-15 NOTE — Telephone Encounter (Signed)
Patient stated she is not comfortable with having the coronary perfusion or calcium scoring test at this time. She fears the radiation. She wanted to know for her renal stone surgical clearance if the echo, Zio, and a treadmill stress test would suffice. Regarding treadmill, she said she can't run, but can walk fast. She began to cry from stressing out over upcoming surgery and "all of theses heart tests." She wants to revisit the CCTA/scoring in January. Please advise.

## 2022-04-15 NOTE — Telephone Encounter (Signed)
  Pt requesting to speak with RN [facility] regarding all her tests

## 2022-04-16 NOTE — Telephone Encounter (Signed)
Myoview, echo and calcium score cancelled.  Stress echo ordered.

## 2022-04-16 NOTE — Telephone Encounter (Signed)
Spoke with patient.  She is adamant about not wanting any more radiation.  Recommend canceling Myoview.  Instead, recommend stress echocardiogram- can we get her scheduled before her surgery?

## 2022-04-22 ENCOUNTER — Encounter (HOSPITAL_COMMUNITY): Payer: Medicare Other

## 2022-04-22 DIAGNOSIS — R002 Palpitations: Secondary | ICD-10-CM

## 2022-05-01 ENCOUNTER — Telehealth (HOSPITAL_COMMUNITY): Payer: Self-pay | Admitting: *Deleted

## 2022-05-01 NOTE — Telephone Encounter (Signed)
Patient given detailed instructions per Stress Test Requisition Sheet for test on 05/06/2022 at 2:30.Patient Notified to arrive 30 minutes early, and that it is imperative to arrive on time for appointment to keep from having the test rescheduled.  Patient verbalized understanding. Lauren Sanchez

## 2022-05-06 ENCOUNTER — Ambulatory Visit (HOSPITAL_BASED_OUTPATIENT_CLINIC_OR_DEPARTMENT_OTHER): Payer: Medicare Other

## 2022-05-06 ENCOUNTER — Ambulatory Visit (HOSPITAL_COMMUNITY): Payer: Medicare Other | Attending: Cardiology

## 2022-05-06 DIAGNOSIS — R079 Chest pain, unspecified: Secondary | ICD-10-CM | POA: Diagnosis present

## 2022-05-07 LAB — ECHOCARDIOGRAM STRESS TEST
Area-P 1/2: 2.46 cm2
S' Lateral: 2.6 cm

## 2022-05-08 ENCOUNTER — Ambulatory Visit: Payer: Medicare Other | Attending: Cardiology | Admitting: Cardiology

## 2022-05-08 ENCOUNTER — Encounter: Payer: Self-pay | Admitting: Cardiology

## 2022-05-08 VITALS — BP 122/74 | HR 86 | Ht 61.0 in | Wt 102.4 lb

## 2022-05-08 DIAGNOSIS — I456 Pre-excitation syndrome: Secondary | ICD-10-CM

## 2022-05-08 DIAGNOSIS — E785 Hyperlipidemia, unspecified: Secondary | ICD-10-CM | POA: Diagnosis not present

## 2022-05-08 DIAGNOSIS — Z01818 Encounter for other preprocedural examination: Secondary | ICD-10-CM

## 2022-05-08 DIAGNOSIS — R079 Chest pain, unspecified: Secondary | ICD-10-CM | POA: Diagnosis present

## 2022-05-08 DIAGNOSIS — R002 Palpitations: Secondary | ICD-10-CM

## 2022-05-08 MED ORDER — ASPIRIN 81 MG PO TBEC: 81.0000 mg | DELAYED_RELEASE_TABLET | Freq: Every day | ORAL | 3 refills | Status: DC

## 2022-05-08 NOTE — Patient Instructions (Signed)
Medication Instructions:  START Aspirin 81 mg daily   *If you need a refill on your cardiac medications before your next appointment, please call your pharmacy*   Lab Work: CBC, BMET today   If you have labs (blood work) drawn today and your tests are completely normal, you will receive your results only by: [place] (if you have MyChart) OR A paper copy in the mail If you have any lab test that is abnormal or we need to change your treatment, we will call you to review the results.   Testing/Procedures: Your physician has requested that you have a cardiac catheterization. Cardiac catheterization is used to diagnose and/or treat various heart conditions. Doctors may recommend this procedure for a number of different reasons. The most common reason is to evaluate chest pain. Chest pain can be a symptom of coronary artery disease (CAD), and cardiac catheterization can show whether plaque is narrowing or blocking your heart's arteries. This procedure is also used to evaluate the valves, as well as measure the blood flow and oxygen levels in different parts of your heart. For further information please visit [email or web address]. Please follow instruction sheet, as given.    Follow-Up: At [facility], you and your health needs are our priority.  As part of our continuing mission to provide you with exceptional heart care, we have created designated Provider Care Teams.  These Care Teams include your primary Cardiologist (physician) and Advanced Practice Providers (APPs -  Physician Assistants and Nurse Practitioners) who all work together to provide you with the care you need, when you need it.  We recommend signing up for the patient portal called "MyChart".  Sign up information is provided on this After Visit Summary.  MyChart is used to connect with patients for Virtual Visits (Telemedicine).  Patients are able to view lab/test results, encounter notes, upcoming appointments,  etc.  Non-urgent messages can be sent to your provider as well.   To learn more about what you can do with MyChart, go to [email or web address].    Your next appointment:   1 month(s)  The format for your next appointment:   In Person  Provider:   Oswaldo Milian, MD   Other Instructions  [place] A DEPT OF [place] A DEPT OF [place]. [facility] [place] [id number] [place] [id number] Dept: [phone] Loc: [place]  05/08/2022  You are scheduled for a Cardiac Catheterization on Monday, December 11 with Dr. Quay Burow.  1. Please arrive at the [facility] (Main Entrance A) at [facility]: [address] [place], [place] [address] at 5:30 AM (This time is two hours before your procedure to ensure your preparation). Free valet parking service is available.   Special note: Every effort is made to have your procedure done on time. Please understand that emergencies sometimes delay scheduled procedures.  2. Diet: Do not eat solid foods after midnight.  The patient may have clear liquids until 5am upon the day of the procedure.  3. Labs: You will need to have blood drawn today- CBC, BMET. You do not need to be fasting.  4. Medication instructions in preparation for your procedure:   Contrast Allergy: No   On the morning of your procedure, take your Aspirin 81 mg and any morning medicines NOT listed above.  You may use sips of water.  5. Plan for one night stay--bring personal belongings.  6. Bring a current list of your medications and current insurance cards. 7. You MUST have a responsible person to drive you home. 8. Someone MUST be with you the first 24 hours after you arrive home or your discharge will be delayed. 9. Please wear clothes that are easy to get on and off and wear slip-on shoes.  Thank you for allowing [place] to care for you!   --  [facility]

## 2022-05-08 NOTE — H&P (View-Only) (Signed)
Cardiology Office Note:    Date:  05/08/2022   ID:  Lauren Sanchez, DOB June 19, 1955, MRN [id number]  PCP:  Manfred Shirts, PA  Cardiologist:  None  Electrophysiologist:  None   Referring MD: Manfred Shirts, PA   Chief Complaint  Patient presents with   Chest Pain    History of Present Illness:    Lauren Sanchez is a 66 y.o. female with a hx of WPW, hyperlipidemia who presents for follow-up.  She was seen in the ED on 04/06/2022 with chest pain.  Troponin 17 > 23 > 17, D-dimer negative.  She saw Dr. Rayann Heman in 11/23/2009 for preexcitation seen on EKG.  No evidence of SVT.  Recommended GXT to evaluate conduction properties of her accessory pathway.  GXT 01/2010 was nondiagnostic for ischemia due to preexcitation, no arrhythmias during exercise.  She reports she had an episode this past summer where felt like heart was racing.  Lasted about 2 minutes.  Denies any recent palpitations, lightheadedness, or syncope.  She reports she was recently diagnosed with likely shingles and started on Valtrex.  Was having pain on right side of back.  When she presented to the ED on 04/06/2022 she had began having pain in left lower chest.  States that pain was constant and stabbing, worse with deep breathing.  Lasted for hours.  Does continue to have some pain since discharge from the ED but improving.  She walks daily for about 30 minutes, denies any exertional symptoms.  She smokes 3 to 4 cigarettes/day, was smoking up to 0.5 packs/day, and has smoked since her 40s.  Family history includes both parents had pacemakers but she is unsure of details.  Stress echocardiogram 05/06/2022 was intermediate to high risk study with stress-induced wall motion abnormalities as well as horizontal ST depressions in inferolateral leads.  Since last clinic visit, she reports she is doing okay.  Continues to have rare chest pain, but describes it as only with deep breathing on far left side.  Felt very short of breath during her stress  test but denied any chest pain.   Past Medical History:  Diagnosis Date   Allergy    very mild with pollen , itchy eyes    Arthritis    slight in fingers    Complication of anesthesia    with surgery in 12/2015 - felt draggy for several days    Family history of anesthesia complication    PT STATES HER MOTHER IS 90 YRS OLD - BUT HAD SOME PROBLEM IN HER 20'S OR 30'S WHEN PUT UNDER FOR ANESTHESIA .  PT PLANS TO CALL HER MOTHER AND ASK HER IF SHE HAS MORE DETAILS.   History of kidney stones 03/17/2013   STATES KIDNEY STONE FOR PAST 2 MONTHS - CAUSING A LOT OF PAIN AND N&V-UNABLE TO PASS STONE.  PREVIOUS HX OF STONES   Hx of adenomatous polyp of colon    Hyperlipidemia    IBS (irritable bowel syndrome)    Gluten Free currently 04-25-2020  - IBS in her 20's    Thyroid disease    nodule-gluten free diet and thyroid blood studies w/in normal past 8 monts- is followed by endocrinologist   WPW (Wolff-Parkinson-White syndrome)    DOCUMENTED ON EKG'S DATING BACK TO 2011--PT SAW CARDIOLOGIST DR. ALLRED IN 2011 - HAD STRESS TEST "NONDIAGNOSTIC FOR ISCHEMIA DUE TO PRE-EXCITATION"  "NO ARRHYTHMIAS DURING EXERCISE".  PT STATES  NOT AWARE OF ANY PROBLEMS FROM WPW - HER LAST EKG WAS 08/25/12  WITH DR. Etter Sjogren SHOWING WPW SYNDROME    Past Surgical History:  Procedure Laterality Date   COLONOSCOPY     CYSTOSCOPY WITH RETROGRADE PYELOGRAM, URETEROSCOPY AND STENT PLACEMENT Left 02/23/2016   Procedure: CYSTOSCOPY WITH LEFT RETROGRADE PYELOGRAM, LEFT URETEROSCOPY  AND LEFT STENT PLACEMENT;  Surgeon: Ardis Hughs, MD;  Location: [facility];  Service: Urology;  Laterality: Left;   CYSTOSCOPY WITH RETROGRADE PYELOGRAM, URETEROSCOPY AND STENT PLACEMENT Left 03/23/2020   Procedure: CYSTOSCOPY WITH RETROGRADE PYELOGRAM, LEFT URETEROSCOPY HOLMIUM LASER AND STENT PLACEMENT;  Surgeon: Ardis Hughs, MD;  Location[facility];  Service: Urology;  Laterality: Left;   CYSTOSCOPY WITH URETEROSCOPY,  STONE BASKETRY AND STENT PLACEMENT Right 03/18/2013   Procedure: RIGHT URETEROSCOPY,  STONE REMOVAL AND STENT PLACEMENT, retrograde;  Surgeon: Ardis Hughs, MD;  Location: [facility];  Service: Urology;  Laterality: Right;   DILATION AND CURETTAGE OF UTERUS     x 2   HEMORRHOID BANDING     Dr Rosana Hoes- done in [place] office   HYSTEROSCOPY     12/2015- in epic under Care Everywhere     Current Medications: Current Meds  Medication Sig   aspirin EC 81 MG tablet Take 1 tablet (81 mg total) by mouth daily. Swallow whole.   cholecalciferol (VITAMIN D3) 25 MCG (1000 UNIT) tablet Take 2,000 Units by mouth daily. Pt takes 2000IU daily    Magnesium 300 MG CAPS Take by mouth.   naproxen (NAPROSYN) 500 MG tablet Take 1 tablet (500 mg total) by mouth 2 (two) times daily as needed.   Omega-3 Fatty Acids (FISH OIL) 1000 MG CAPS Take by mouth daily.   UNABLE TO FIND Cholestepure- Phytosterol Complex   valACYclovir (VALTREX) 1000 MG tablet Take by mouth.   [DISCONTINUED] triamcinolone (KENALOG) 0.1 % Apply topically 2 (two) times daily as needed.     Allergies:   Doxycycline, Gluten meal, Hibiclens [chlorhexidine gluconate], Neomycin-polymyxin-dexameth, Rapaflo [silodosin], Simvastatin, and Tamsulosin   Social History   Socioeconomic History   Marital status: Married    Spouse name: Not on file   Number of children: Not on file   Years of education: Not on file   Highest education level: Not on file  Occupational History   Not on file  Tobacco Use   Smoking status: Some Days    Packs/day: 0.25    Years: 30.00    Total pack years: 7.50    Types: Cigarettes   Smokeless tobacco: Never  Vaping Use   Vaping Use: Never used  Substance and Sexual Activity   Alcohol use: Never   Drug use: No   Sexual activity: Yes  Other Topics Concern   Not on file  Social History Narrative   Not on file   Social Determinants of Health   Financial Resource Strain: Not on file  Food Insecurity: Not on  file  Transportation Needs: Not on file  Physical Activity: Not on file  Stress: Not on file  Social Connections: Not on file     Family History: The patient's family history includes Arthritis in her mother; Cataracts in her father; Colon cancer in her maternal grandmother; Colon polyps in her mother; Dementia in an other family member; Diabetes in her father; Diverticulitis in her father; Endometriosis in her sister; Glaucoma in her father; Heart disease in her father and mother; Heart murmur in her mother; Hypertension in her father; Lung cancer in an other family member; Macular degeneration in her mother; Osteopenia in her mother; Skin  cancer in her father; Thyroid nodules in her sister. There is no history of Kidney disease, Liver disease, Esophageal cancer, Rectal cancer, or Stomach cancer.  ROS:   Please see the history of present illness.     All other systems reviewed and are negative.  EKGs/Labs/Other Studies Reviewed:    The following studies were reviewed today:   EKG:   04/11/2022: Normal sinus rhythm, rate 66, QRS 76 no evidence of preexcitation, QTc 402 05/08/2022: Normal sinus rhythm, rate 86, less than 1 mm ST depressions in leads V4-6  Recent Labs: 04/06/2022: BUN 15; Creatinine, Ser 0.72; Hemoglobin 13.0; Platelets 278; Potassium 4.2; Sodium 138  Recent Lipid Panel    Component Value Date/Time   CHOL 261 (H) 12/02/2012 0921   TRIG 106 12/02/2012 0921   HDL 71 12/02/2012 0921   CHOLHDL 3 07/08/2011 1014   VLDL 19.6 07/08/2011 1014   LDLCALC 169 (H) 12/02/2012 0921   LDLDIRECT 137.9 03/09/2010 1029    Physical Exam:    VS:  BP 122/74   Pulse 86   Ht '5\' 1"'$  (1.549 m)   Wt 102 lb 6.4 oz (46.4 kg)   SpO2 98%   BMI 19.35 kg/m     Wt Readings from Last 3 Encounters:  05/08/22 102 lb 6.4 oz (46.4 kg)  04/11/22 103 lb 9.6 oz (47 kg)  04/06/22 100 lb (45.4 kg)     GEN:  Well nourished, well developed in no acute distress HEENT: Normal NECK: No JVD; No  carotid bruits LYMPHATICS: No lymphadenopathy CARDIAC: RRR, no murmurs, rubs, gallops RESPIRATORY:  Clear to auscultation without rales, wheezing or rhonchi  ABDOMEN: Soft, non-tender, non-distended MUSCULOSKELETAL:  No edema; No deformity  SKIN: Warm and dry NEUROLOGIC:  Alert and oriented x 3 PSYCHIATRIC:  Normal affect   ASSESSMENT:    1. Chest pain of uncertain etiology   2. Palpitations   3. Hyperlipidemia, unspecified hyperlipidemia type   4. WPW (Wolff-Parkinson-White syndrome)   5. Pre-op evaluation     PLAN:    Chest pain: Atypical in description but does have significant CAD risk factors (age, hyperlipidemia, tobacco use).  She has upcoming surgery for kidney stones.  Had discussed coronary CTA or Myoview but she did not want to have any radiation.  Switched to stress echocardiogram.  Stress echocardiogram 05/06/2022 was intermediate to high risk study with stress-induced wall motion abnormalities as well as horizontal ST depressions in inferolateral leads. -Start aspirin 81 mg daily -Recommend cardiac catheterization.  Scheduled with Dr. Gwenlyn Found on 12/11. Risks and benefits of cardiac catheterization have been discussed with the patient.  These include bleeding, infection, kidney damage, stroke, heart attack, death.  The patient understands these risks and is willing to proceed.  Preop evaluation: Prior to surgery for kidney stones, currently scheduled for 12/28.  Given positive stress test, plan cardiac catheterization as above.  Discussed that if stent is placed would need to delay surgery until can stop Plavix in 6 months  WPW: She saw Dr. Rayann Heman in 11/23/2009 for preexcitation seen on EKG.  No evidence of SVT.  Recommended GXT to evaluate conduction properties of her accessory pathway.  GXT 01/2010 was nondiagnostic for ischemia due to preexcitation, no arrhythmias during exercise. -EKG today shows no preexcitation.  She does report episode of palpitations where feels like heart  was racing.  Zio patch x2 weeks was done, results pending  HLD: LDL 159 on 04/09/2022.  She reports intolerance to multiple statins.  Will need referral to pharmacy lipid  clinic to evaluate for PCSK9 inhibitor if cath confirms CAD  Tobacco use: Counseled on the risk of tobacco use and cessation strongly recommended   RTC in 1 month     Medication Adjustments/Labs and Tests Ordered: Current medicines are reviewed at length with the patient today.  Concerns regarding medicines are outlined above.  Orders Placed This Encounter  Procedures   CBC   Basic metabolic panel   EKG [id number]   Meds ordered this encounter  Medications   aspirin EC 81 MG tablet    Sig: Take 1 tablet (81 mg total) by mouth daily. Swallow whole.    Dispense:  90 tablet    Refill:  3    Patient Instructions  Medication Instructions:  START Aspirin 81 mg daily   *If you need a refill on your cardiac medications before your next appointment, please call your pharmacy*   Lab Work: CBC, BMET today   If you have labs (blood work) drawn today and your tests are completely normal, you will receive your results only by: [place] (if you have MyChart) OR A paper copy in the mail If you have any lab test that is abnormal or we need to change your treatment, we will call you to review the results.   Testing/Procedures: Your physician has requested that you have a cardiac catheterization. Cardiac catheterization is used to diagnose and/or treat various heart conditions. Doctors may recommend this procedure for a number of different reasons. The most common reason is to evaluate chest pain. Chest pain can be a symptom of coronary artery disease (CAD), and cardiac catheterization can show whether plaque is narrowing or blocking your heart's arteries. This procedure is also used to evaluate the valves, as well as measure the blood flow and oxygen levels in different parts of your heart. For further information  please visit [email or web address]. Please follow instruction sheet, as given.    Follow-Up: At [facility], you and your health needs are our priority.  As part of our continuing mission to provide you with exceptional heart care, we have created designated Provider Care Teams.  These Care Teams include your primary Cardiologist (physician) and Advanced Practice Providers (APPs -  Physician Assistants and Nurse Practitioners) who all work together to provide you with the care you need, when you need it.  We recommend signing up for the patient portal called "MyChart".  Sign up information is provided on this After Visit Summary.  MyChart is used to connect with patients for Virtual Visits (Telemedicine).  Patients are able to view lab/test results, encounter notes, upcoming appointments, etc.  Non-urgent messages can be sent to your provider as well.   To learn more about what you can do with MyChart, go to [email or web address].    Your next appointment:   1 month(s)  The format for your next appointment:   In Person  Provider:   Oswaldo Milian, MD   Other Instructions  [place] A DEPT OF [place] A DEPT OF [place]. [facility] [place] [id number] [place] [address] Dept: [phone] Loc: [place]  05/08/2022  You are scheduled for a Cardiac Catheterization on Monday, December 11 with Dr. Quay Burow.  1. Please arrive at the [facility] (Main Entrance A) at [facility]: [address] [place],  [address] at 5:30 AM (This time is two hours before your procedure to ensure your preparation).  Free valet parking service is available.   Special note: Every effort is made to have your procedure done on time. Please understand that emergencies sometimes delay scheduled procedures.  2. Diet: Do not eat solid foods after midnight.  The  patient may have clear liquids until 5am upon the day of the procedure.  3. Labs: You will need to have blood drawn today- CBC, BMET. You do not need to be fasting.  4. Medication instructions in preparation for your procedure:   Contrast Allergy: No   On the morning of your procedure, take your Aspirin 81 mg and any morning medicines NOT listed above.  You may use sips of water.  5. Plan for one night stay--bring personal belongings. 6. Bring a current list of your medications and current insurance cards. 7. You MUST have a responsible person to drive you home. 8. Someone MUST be with you the first 24 hours after you arrive home or your discharge will be delayed. 9. Please wear clothes that are easy to get on and off and wear slip-on shoes.  Thank you for allowing [place] to care for you!   -- [facility] Invasive Cardiovascular services           Signed, Donato Heinz, MD  05/08/2022 12:23 PM    [place]

## 2022-05-08 NOTE — Progress Notes (Signed)
Cardiology Office Note:    Date:  05/08/2022   ID:  Lauren Sanchez, DOB 12/06/1955, MRN [id number]  PCP:  Manfred Shirts, PA  Cardiologist:  None  Electrophysiologist:  None   Referring MD: Manfred Shirts, PA   Chief Complaint  Patient presents with   Chest Pain    History of Present Illness:    Lauren Sanchez is a 66 y.o. female with a hx of WPW, hyperlipidemia who presents for follow-up.  She was seen in the ED on 04/06/2022 with chest pain.  Troponin 17 > 23 > 17, D-dimer negative.  She saw Dr. Rayann Heman in 11/23/2009 for preexcitation seen on EKG.  No evidence of SVT.  Recommended GXT to evaluate conduction properties of her accessory pathway.  GXT 01/2010 was nondiagnostic for ischemia due to preexcitation, no arrhythmias during exercise.  She reports she had an episode this past summer where felt like heart was racing.  Lasted about 2 minutes.  Denies any recent palpitations, lightheadedness, or syncope.  She reports she was recently diagnosed with likely shingles and started on Valtrex.  Was having pain on right side of back.  When she presented to the ED on 04/06/2022 she had began having pain in left lower chest.  States that pain was constant and stabbing, worse with deep breathing.  Lasted for hours.  Does continue to have some pain since discharge from the ED but improving.  She walks daily for about 30 minutes, denies any exertional symptoms.  She smokes 3 to 4 cigarettes/day, was smoking up to 0.5 packs/day, and has smoked since her 100s.  Family history includes both parents had pacemakers but she is unsure of details.  Stress echocardiogram 05/06/2022 was intermediate to high risk study with stress-induced wall motion abnormalities as well as horizontal ST depressions in inferolateral leads.  Since last clinic visit, she reports she is doing okay.  Continues to have rare chest pain, but describes it as only with deep breathing on far left side.  Felt very short of breath during her stress  test but denied any chest pain.   Past Medical History:  Diagnosis Date   Allergy    very mild with pollen , itchy eyes    Arthritis    slight in fingers    Complication of anesthesia    with surgery in 12/2015 - felt draggy for several days    Family history of anesthesia complication    PT STATES HER MOTHER IS 90 YRS OLD - BUT HAD SOME PROBLEM IN HER 20'S OR 30'S WHEN PUT UNDER FOR ANESTHESIA .  PT PLANS TO CALL HER MOTHER AND ASK HER IF SHE HAS MORE DETAILS.   History of kidney stones 03/17/2013   STATES KIDNEY STONE FOR PAST 2 MONTHS - CAUSING A LOT OF PAIN AND N&V-UNABLE TO PASS STONE.  PREVIOUS HX OF STONES   Hx of adenomatous polyp of colon    Hyperlipidemia    IBS (irritable bowel syndrome)    Gluten Free currently 04-25-2020  - IBS in her 20's    Thyroid disease    nodule-gluten free diet and thyroid blood studies w/in normal past 8 monts- is followed by endocrinologist   WPW (Wolff-Parkinson-White syndrome)    DOCUMENTED ON EKG'S DATING BACK TO 2011--PT SAW CARDIOLOGIST DR. ALLRED IN 2011 - HAD STRESS TEST "NONDIAGNOSTIC FOR ISCHEMIA DUE TO PRE-EXCITATION"  "NO ARRHYTHMIAS DURING EXERCISE".  PT STATES  NOT AWARE OF ANY PROBLEMS FROM WPW - HER LAST EKG WAS 08/25/12  WITH DR. Etter Sjogren SHOWING WPW SYNDROME    Past Surgical History:  Procedure Laterality Date   COLONOSCOPY     CYSTOSCOPY WITH RETROGRADE PYELOGRAM, URETEROSCOPY AND STENT PLACEMENT Left 02/23/2016   Procedure: CYSTOSCOPY WITH LEFT RETROGRADE PYELOGRAM, LEFT URETEROSCOPY  AND LEFT STENT PLACEMENT;  Surgeon: Ardis Hughs, MD;  Location: [facility];  Service: Urology;  Laterality: Left;   CYSTOSCOPY WITH RETROGRADE PYELOGRAM, URETEROSCOPY AND STENT PLACEMENT Left 03/23/2020   Procedure: CYSTOSCOPY WITH RETROGRADE PYELOGRAM, LEFT URETEROSCOPY HOLMIUM LASER AND STENT PLACEMENT;  Surgeon: Ardis Hughs, MD;  Location[facility];  Service: Urology;  Laterality: Left;   CYSTOSCOPY WITH URETEROSCOPY,  STONE BASKETRY AND STENT PLACEMENT Right 03/18/2013   Procedure: RIGHT URETEROSCOPY,  STONE REMOVAL AND STENT PLACEMENT, retrograde;  Surgeon: Ardis Hughs, MD;  Location: [facility];  Service: Urology;  Laterality: Right;   DILATION AND CURETTAGE OF UTERUS     x 2   HEMORRHOID BANDING     Dr Rosana Hoes- done in [place] office   HYSTEROSCOPY     12/2015- in epic under Care Everywhere     Current Medications: Current Meds  Medication Sig   aspirin EC 81 MG tablet Take 1 tablet (81 mg total) by mouth daily. Swallow whole.   cholecalciferol (VITAMIN D3) 25 MCG (1000 UNIT) tablet Take 2,000 Units by mouth daily. Pt takes 2000IU daily    Magnesium 300 MG CAPS Take by mouth.   naproxen (NAPROSYN) 500 MG tablet Take 1 tablet (500 mg total) by mouth 2 (two) times daily as needed.   Omega-3 Fatty Acids (FISH OIL) 1000 MG CAPS Take by mouth daily.   UNABLE TO FIND Cholestepure- Phytosterol Complex   valACYclovir (VALTREX) 1000 MG tablet Take by mouth.   [DISCONTINUED] triamcinolone (KENALOG) 0.1 % Apply topically 2 (two) times daily as needed.     Allergies:   Doxycycline, Gluten meal, Hibiclens [chlorhexidine gluconate], Neomycin-polymyxin-dexameth, Rapaflo [silodosin], Simvastatin, and Tamsulosin   Social History   Socioeconomic History   Marital status: Married    Spouse name: Not on file   Number of children: Not on file   Years of education: Not on file   Highest education level: Not on file  Occupational History   Not on file  Tobacco Use   Smoking status: Some Days    Packs/day: 0.25    Years: 30.00    Total pack years: 7.50    Types: Cigarettes   Smokeless tobacco: Never  Vaping Use   Vaping Use: Never used  Substance and Sexual Activity   Alcohol use: Never   Drug use: No   Sexual activity: Yes  Other Topics Concern   Not on file  Social History Narrative   Not on file   Social Determinants of Health   Financial Resource Strain: Not on file  Food Insecurity: Not on  file  Transportation Needs: Not on file  Physical Activity: Not on file  Stress: Not on file  Social Connections: Not on file     Family History: The patient's family history includes Arthritis in her mother; Cataracts in her father; Colon cancer in her maternal grandmother; Colon polyps in her mother; Dementia in an other family member; Diabetes in her father; Diverticulitis in her father; Endometriosis in her sister; Glaucoma in her father; Heart disease in her father and mother; Heart murmur in her mother; Hypertension in her father; Lung cancer in an other family member; Macular degeneration in her mother; Osteopenia in her mother; Skin  cancer in her father; Thyroid nodules in her sister. There is no history of Kidney disease, Liver disease, Esophageal cancer, Rectal cancer, or Stomach cancer.  ROS:   Please see the history of present illness.     All other systems reviewed and are negative.  EKGs/Labs/Other Studies Reviewed:    The following studies were reviewed today:   EKG:   04/11/2022: Normal sinus rhythm, rate 66, QRS 76 no evidence of preexcitation, QTc 402 05/08/2022: Normal sinus rhythm, rate 86, less than 1 mm ST depressions in leads V4-6  Recent Labs: 04/06/2022: BUN 15; Creatinine, Ser 0.72; Hemoglobin 13.0; Platelets 278; Potassium 4.2; Sodium 138  Recent Lipid Panel    Component Value Date/Time   CHOL 261 (H) 12/02/2012 0921   TRIG 106 12/02/2012 0921   HDL 71 12/02/2012 0921   CHOLHDL 3 07/08/2011 1014   VLDL 19.6 07/08/2011 1014   LDLCALC 169 (H) 12/02/2012 0921   LDLDIRECT 137.9 03/09/2010 1029    Physical Exam:    VS:  BP 122/74   Pulse 86   Ht '5\' 1"'$  (1.549 m)   Wt 102 lb 6.4 oz (46.4 kg)   SpO2 98%   BMI 19.35 kg/m     Wt Readings from Last 3 Encounters:  05/08/22 102 lb 6.4 oz (46.4 kg)  04/11/22 103 lb 9.6 oz (47 kg)  04/06/22 100 lb (45.4 kg)     GEN:  Well nourished, well developed in no acute distress HEENT: Normal NECK: No JVD; No  carotid bruits LYMPHATICS: No lymphadenopathy CARDIAC: RRR, no murmurs, rubs, gallops RESPIRATORY:  Clear to auscultation without rales, wheezing or rhonchi  ABDOMEN: Soft, non-tender, non-distended MUSCULOSKELETAL:  No edema; No deformity  SKIN: Warm and dry NEUROLOGIC:  Alert and oriented x 3 PSYCHIATRIC:  Normal affect   ASSESSMENT:    1. Chest pain of uncertain etiology   2. Palpitations   3. Hyperlipidemia, unspecified hyperlipidemia type   4. WPW (Wolff-Parkinson-White syndrome)   5. Pre-op evaluation     PLAN:    Chest pain: Atypical in description but does have significant CAD risk factors (age, hyperlipidemia, tobacco use).  She has upcoming surgery for kidney stones.  Had discussed coronary CTA or Myoview but she did not want to have any radiation.  Switched to stress echocardiogram.  Stress echocardiogram 05/06/2022 was intermediate to high risk study with stress-induced wall motion abnormalities as well as horizontal ST depressions in inferolateral leads. -Start aspirin 81 mg daily -Recommend cardiac catheterization.  Scheduled with Dr. Gwenlyn Found on 12/11. Risks and benefits of cardiac catheterization have been discussed with the patient.  These include bleeding, infection, kidney damage, stroke, heart attack, death.  The patient understands these risks and is willing to proceed.  Preop evaluation: Prior to surgery for kidney stones, currently scheduled for 12/28.  Given positive stress test, plan cardiac catheterization as above.  Discussed that if stent is placed would need to delay surgery until can stop Plavix in 6 months  WPW: She saw Dr. Rayann Heman in 11/23/2009 for preexcitation seen on EKG.  No evidence of SVT.  Recommended GXT to evaluate conduction properties of her accessory pathway.  GXT 01/2010 was nondiagnostic for ischemia due to preexcitation, no arrhythmias during exercise. -EKG today shows no preexcitation.  She does report episode of palpitations where feels like heart  was racing.  Zio patch x2 weeks was done, results pending  HLD: LDL 159 on 04/09/2022.  She reports intolerance to multiple statins.  Will need referral to pharmacy lipid  clinic to evaluate for PCSK9 inhibitor if cath confirms CAD  Tobacco use: Counseled on the risk of tobacco use and cessation strongly recommended   RTC in 1 month     Medication Adjustments/Labs and Tests Ordered: Current medicines are reviewed at length with the patient today.  Concerns regarding medicines are outlined above.  Orders Placed This Encounter  Procedures   CBC   Basic metabolic panel   EKG 62-GBTD   Meds ordered this encounter  Medications   aspirin EC 81 MG tablet    Sig: Take 1 tablet (81 mg total) by mouth daily. Swallow whole.    Dispense:  90 tablet    Refill:  3    Patient Instructions  Medication Instructions:  START Aspirin 81 mg daily   *If you need a refill on your cardiac medications before your next appointment, please call your pharmacy*   Lab Work: CBC, BMET today   If you have labs (blood work) drawn today and your tests are completely normal, you will receive your results only by: [place] (if you have MyChart) OR A paper copy in the mail If you have any lab test that is abnormal or we need to change your treatment, we will call you to review the results.   Testing/Procedures: Your physician has requested that you have a cardiac catheterization. Cardiac catheterization is used to diagnose and/or treat various heart conditions. Doctors may recommend this procedure for a number of different reasons. The most common reason is to evaluate chest pain. Chest pain can be a symptom of coronary artery disease (CAD), and cardiac catheterization can show whether plaque is narrowing or blocking your heart's arteries. This procedure is also used to evaluate the valves, as well as measure the blood flow and oxygen levels in different parts of your heart. For further information  please visit [email or web address]. Please follow instruction sheet, as given.    Follow-Up: At [facility], you and your health needs are our priority.  As part of our continuing mission to provide you with exceptional heart care, we have created designated Provider Care Teams.  These Care Teams include your primary Cardiologist (physician) and Advanced Practice Providers (APPs -  Physician Assistants and Nurse Practitioners) who all work together to provide you with the care you need, when you need it.  We recommend signing up for the patient portal called "MyChart".  Sign up information is provided on this After Visit Summary.  MyChart is used to connect with patients for Virtual Visits (Telemedicine).  Patients are able to view lab/test results, encounter notes, upcoming appointments, etc.  Non-urgent messages can be sent to your provider as well.   To learn more about what you can do with MyChart, go to [email or web address].    Your next appointment:   1 month(s)  The format for your next appointment:   In Person  Provider:   Oswaldo Milian, MD   Other Instructions  [place] A DEPT OF [place] A DEPT OF [place]. [facility] [place] [id number] [place] [address] Dept: [phone] Loc: [place]  05/08/2022  You are scheduled for a Cardiac Catheterization on Monday, December 11 with Dr. Quay Burow.  1. Please arrive at the [facility] (Main Entrance A) at [facility]: [address] [place], [place] [address] at 5:30 AM (This time is two hours before your procedure to ensure your preparation).  Free valet parking service is available.   Special note: Every effort is made to have your procedure done on time. Please understand that emergencies sometimes delay scheduled procedures.  2. Diet: Do not eat solid foods after midnight.  The  patient may have clear liquids until 5am upon the day of the procedure.  3. Labs: You will need to have blood drawn today- CBC, BMET. You do not need to be fasting.  4. Medication instructions in preparation for your procedure:   Contrast Allergy: No   On the morning of your procedure, take your Aspirin 81 mg and any morning medicines NOT listed above.  You may use sips of water.  5. Plan for one night stay--bring personal belongings. 6. Bring a current list of your medications and current insurance cards. 7. You MUST have a responsible person to drive you home. 8. Someone MUST be with you the first 24 hours after you arrive home or your discharge will be delayed. 9. Please wear clothes that are easy to get on and off and wear slip-on shoes.  Thank you for allowing [place] to care for you!   -- [facility] Invasive Cardiovascular services           Signed, Donato Heinz, MD  05/08/2022 12:23 PM    [place]

## 2022-05-09 ENCOUNTER — Other Ambulatory Visit (HOSPITAL_COMMUNITY): Payer: Medicare Other

## 2022-05-09 ENCOUNTER — Telehealth: Payer: Self-pay | Admitting: *Deleted

## 2022-05-09 LAB — BASIC METABOLIC PANEL
BUN/Creatinine Ratio: 20 (ref 12–28)
BUN: 14 mg/dL (ref 8–27)
CO2: 27 mmol/L (ref 20–29)
Calcium: 9.7 mg/dL (ref 8.7–10.3)
Chloride: 103 mmol/L (ref 96–106)
Creatinine, Ser: 0.69 mg/dL (ref 0.57–1.00)
Glucose: 105 mg/dL — ABNORMAL HIGH (ref 70–99)
Potassium: 4.8 mmol/L (ref 3.5–5.2)
Sodium: 142 mmol/L (ref 134–144)
eGFR: 95 mL/min/{1.73_m2} (ref >59)

## 2022-05-09 LAB — CBC
Hematocrit: 41.1 % (ref 34.0–46.6)
Hemoglobin: 13.9 g/dL (ref 11.1–15.9)
MCH: 30.2 pg (ref 26.6–33.0)
MCHC: 33.8 g/dL (ref 31.5–35.7)
MCV: 89 fL (ref 79–97)
Platelets: 270 10*3/uL (ref 150–450)
RBC: 4.61 x10E6/uL (ref 3.77–5.28)
RDW: 12.8 % (ref 11.7–15.4)
WBC: 7.5 10*3/uL (ref 3.4–10.8)

## 2022-05-09 NOTE — Telephone Encounter (Addendum)
Cardiac Catheterization scheduled at [facility] for: Monday May 13, 2022 7:30 AM Arrival time and place: [place] Entrance A at: 5:30 AM  Nothing to eat after midnight prior to procedure, clear liquids until 5 AM day of procedure.  Medication instructions: -Usual morning medications can be taken with sips of water including aspirin 81 mg.  Confirmed patient has responsible adult to drive home post procedure and be with patient first 24 hours after arriving home.  Patient reports no new symptoms concerning for COVID-19 in the past 10 days.  Reviewed procedure instructions with patient.

## 2022-05-10 ENCOUNTER — Telehealth: Payer: Self-pay | Admitting: Cardiology

## 2022-05-10 NOTE — Telephone Encounter (Signed)
Patient is calling regarding some questions about her upcoming procedure on 12/11. She is wanting to confirm her having h/o WPW syndrome doesn't make this procedure more difficult for her. She is also wanting to discuss her getting the other upcoming procedure scheduled for 12/28 so close to this one. Please advise.

## 2022-05-10 NOTE — Telephone Encounter (Signed)
Dr. Gardiner Rhyme spoke with patient.

## 2022-05-10 NOTE — Telephone Encounter (Signed)
Pt is calling back requesting to speak to someone in regards to her anxiety and possibly having something called in for her to help with this. Transferred to triage.

## 2022-05-10 NOTE — Telephone Encounter (Signed)
Patient is calling to check on status of her call this morning.  She is very anxious about this, she wants to make sure she gets an answer today since the procedure is being done on Monday.  She would rather hear from Dr. Kennon Holter nurse since he is the one doing the procedure.  She would like for someone to give her call.

## 2022-05-10 NOTE — Telephone Encounter (Signed)
Received call from patient into triage who was crying on the phone stating that she is so anxious about the upcoming procedure Monday and wanting to know if Dr. Gardiner Rhyme could prescribe something for anxiety and to help get her through the weekend and to get her to go to the procedure on Monday. Advised patient that unfortunately we do not prescribe those medications. Patient aware and verbalized understanding,.

## 2022-05-13 ENCOUNTER — Ambulatory Visit (HOSPITAL_COMMUNITY)
Admission: RE | Admit: 2022-05-13 | Discharge: 2022-05-13 | Disposition: A | Discharge status: Home / Self Care | Payer: Medicare Other | Attending: Cardiovascular Disease | Admitting: Cardiovascular Disease

## 2022-05-13 ENCOUNTER — Encounter (HOSPITAL_COMMUNITY)
Admission: RE | Disposition: A | Discharge status: Home / Self Care | Payer: Self-pay | Source: Home / Self Care | Attending: Cardiovascular Disease

## 2022-05-13 ENCOUNTER — Encounter (HOSPITAL_COMMUNITY): Payer: Self-pay | Admitting: Cardiovascular Disease

## 2022-05-13 ENCOUNTER — Other Ambulatory Visit: Payer: Self-pay

## 2022-05-13 DIAGNOSIS — E785 Hyperlipidemia, unspecified: Secondary | ICD-10-CM | POA: Insufficient documentation

## 2022-05-13 DIAGNOSIS — R9439 Abnormal result of other cardiovascular function study: Secondary | ICD-10-CM

## 2022-05-13 DIAGNOSIS — F17210 Nicotine dependence, cigarettes, uncomplicated: Secondary | ICD-10-CM | POA: Insufficient documentation

## 2022-05-13 DIAGNOSIS — R079 Chest pain, unspecified: Secondary | ICD-10-CM | POA: Diagnosis not present

## 2022-05-13 DIAGNOSIS — I456 Pre-excitation syndrome: Secondary | ICD-10-CM | POA: Diagnosis not present

## 2022-05-13 HISTORY — PX: LEFT HEART CATH AND CORONARY ANGIOGRAPHY: CATH118249

## 2022-05-13 SURGERY — LEFT HEART CATH AND CORONARY ANGIOGRAPHY
Anesthesia: LOCAL

## 2022-05-13 MED ORDER — LIDOCAINE HCL (PF) 1 % IJ SOLN
INTRAMUSCULAR | Status: DC | PRN
  Administered 2022-05-13: 2 mL via INTRADERMAL
  Administered 2022-05-13: 20 mL via INTRADERMAL

## 2022-05-13 MED ORDER — VERAPAMIL HCL 2.5 MG/ML IV SOLN
INTRAVENOUS | Status: AC
  Filled 2022-05-13: qty 2

## 2022-05-13 MED ORDER — LABETALOL HCL 5 MG/ML IV SOLN: 10.0000 mg | INTRAVENOUS | Status: DC | PRN

## 2022-05-13 MED ORDER — MIDAZOLAM HCL 2 MG/2ML IJ SOLN
INTRAMUSCULAR | Status: AC
  Filled 2022-05-13: qty 2

## 2022-05-13 MED ORDER — SODIUM CHLORIDE 0.9 % IV SOLN: 250.0000 mL | INTRAVENOUS | Status: DC | PRN

## 2022-05-13 MED ORDER — HEPARIN (PORCINE) IN NACL 1000-0.9 UT/500ML-% IV SOLN
INTRAVENOUS | Status: DC | PRN
  Administered 2022-05-13 (×2): 500 mL

## 2022-05-13 MED ORDER — HEPARIN (PORCINE) IN NACL 1000-0.9 UT/500ML-% IV SOLN
INTRAVENOUS | Status: AC
  Filled 2022-05-13: qty 1000

## 2022-05-13 MED ORDER — HEPARIN SODIUM (PORCINE) 1000 UNIT/ML IJ SOLN
INTRAMUSCULAR | Status: AC
  Filled 2022-05-13: qty 10

## 2022-05-13 MED ORDER — LIDOCAINE HCL (PF) 1 % IJ SOLN
INTRAMUSCULAR | Status: AC
  Filled 2022-05-13: qty 30

## 2022-05-13 MED ORDER — SODIUM CHLORIDE 0.9% FLUSH: 3.0000 mL | Freq: Two times a day (BID) | INTRAVENOUS | Status: DC

## 2022-05-13 MED ORDER — IOHEXOL 350 MG/ML SOLN
INTRAVENOUS | Status: DC | PRN
  Administered 2022-05-13: 30 mL

## 2022-05-13 MED ORDER — ATROPINE SULFATE 1 MG/10ML IJ SOSY
PREFILLED_SYRINGE | INTRAMUSCULAR | Status: AC
  Filled 2022-05-13: qty 10

## 2022-05-13 MED ORDER — FENTANYL CITRATE (PF) 100 MCG/2ML IJ SOLN
INTRAMUSCULAR | Status: DC | PRN
  Administered 2022-05-13 (×2): 25 ug via INTRAVENOUS

## 2022-05-13 MED ORDER — SODIUM CHLORIDE 0.9% FLUSH: 3.0000 mL | INTRAVENOUS | Status: DC | PRN

## 2022-05-13 MED ORDER — ACETAMINOPHEN 325 MG PO TABS: 650.0000 mg | ORAL_TABLET | ORAL | Status: DC | PRN

## 2022-05-13 MED ORDER — NITROGLYCERIN 1 MG/10 ML FOR IR/CATH LAB
INTRA_ARTERIAL | Status: AC
  Filled 2022-05-13: qty 10

## 2022-05-13 MED ORDER — SODIUM CHLORIDE 0.9 % IV SOLN: INTRAVENOUS | Status: AC

## 2022-05-13 MED ORDER — ASPIRIN 81 MG PO CHEW: 81.0000 mg | CHEWABLE_TABLET | ORAL | Status: DC

## 2022-05-13 MED ORDER — SODIUM CHLORIDE 0.9 % WEIGHT BASED INFUSION: 1.0000 mL/kg/h | INTRAVENOUS | Status: DC

## 2022-05-13 MED ORDER — HYDRALAZINE HCL 20 MG/ML IJ SOLN: 10.0000 mg | INTRAMUSCULAR | Status: DC | PRN

## 2022-05-13 MED ORDER — MORPHINE SULFATE (PF) 2 MG/ML IV SOLN: 2.0000 mg | INTRAVENOUS | Status: DC | PRN

## 2022-05-13 MED ORDER — FENTANYL CITRATE (PF) 100 MCG/2ML IJ SOLN
INTRAMUSCULAR | Status: AC
  Filled 2022-05-13: qty 2

## 2022-05-13 MED ORDER — ONDANSETRON HCL 4 MG/2ML IJ SOLN: 4.0000 mg | Freq: Four times a day (QID) | INTRAMUSCULAR | Status: DC | PRN

## 2022-05-13 MED ORDER — SODIUM CHLORIDE 0.9 % WEIGHT BASED INFUSION
3.0000 mL/kg/h | INTRAVENOUS | Status: AC
  Administered 2022-05-13: 3 mL/kg/h via INTRAVENOUS

## 2022-05-13 MED ORDER — MIDAZOLAM HCL 2 MG/2ML IJ SOLN
INTRAMUSCULAR | Status: DC | PRN
  Administered 2022-05-13 (×2): 1 mg via INTRAVENOUS

## 2022-05-13 SURGICAL SUPPLY — 10 items
CATH INFINITI 5FR MULTPACK ANG (CATHETERS) IMPLANT
GLIDESHEATH SLEND A-KIT 6F 22G (SHEATH) IMPLANT
KIT HEART LEFT (KITS) ×1 IMPLANT
PACK CARDIAC CATHETERIZATION (CUSTOM PROCEDURE TRAY) ×1 IMPLANT
SHEATH PINNACLE 5F 10CM (SHEATH) IMPLANT
SHEATH PROBE COVER 6X72 (BAG) IMPLANT
TRANSDUCER W/STOPCOCK (MISCELLANEOUS) ×1 IMPLANT
TUBING CIL FLEX 10 FLL-RA (TUBING) ×1 IMPLANT
WIRE EMERALD 3MM-J .035X150CM (WIRE) IMPLANT
WIRE HI TORQ VERSACORE-J 145CM (WIRE) IMPLANT

## 2022-05-13 NOTE — Progress Notes (Signed)
MD stated med rec. Was done.

## 2022-05-13 NOTE — Progress Notes (Signed)
Patient and husband was given discharge instructions. Both verbalized understanding. 

## 2022-05-13 NOTE — Interval H&P Note (Signed)
Cath Lab Visit (complete for each Cath Lab visit)  Clinical Evaluation Leading to the Procedure:   ACS: No.  Non-ACS:    Anginal Classification: CCS I  Anti-ischemic medical therapy: No Therapy  Non-Invasive Test Results: Intermediate-risk stress test findings: cardiac mortality 1-3%/year  Prior CABG: No previous CABG      History and Physical Interval Note:  05/13/2022 7:48 AM  Lauren Sanchez  has presented today for surgery, with the diagnosis of chest pain - positive stress test.  The various methods of treatment have been discussed with the patient and family. After consideration of risks, benefits and other options for treatment, the patient has consented to  Procedure(s): LEFT HEART CATH AND CORONARY ANGIOGRAPHY (N/A) as a surgical intervention.  The patient's history has been reviewed, patient examined, no change in status, stable for surgery.  I have reviewed the patient's chart and labs.  Questions were answered to the patient's satisfaction.     Quay Burow

## 2022-05-13 NOTE — Progress Notes (Signed)
SITE AREA: RFA  SITE PRIOR TO REMOVAL:  LEVEL 0  PRESSURE APPLIED FOR: 20 minutes  MANUAL: yes  PATIENT STATUS DURING PULL: A & O X4  POST PULL SITE:  LEVEL 0  POST PULL INSTRUCTIONS GIVEN: yes  POST PULL PULSES PRESENT: yes  DRESSING APPLIED: [place] @ [id number]  COMMENTS

## 2022-05-14 ENCOUNTER — Other Ambulatory Visit: Payer: Self-pay | Admitting: *Deleted

## 2022-05-16 ENCOUNTER — Telehealth: Payer: Self-pay | Admitting: Cardiology

## 2022-05-16 NOTE — Telephone Encounter (Signed)
Spoke with patient. She had cath on 12/11 - wants to know if she can drive today. Advised will check on this.   She also needs to r/s her 06/05/22 visit to after 06/12/22 d/t urological procedure. Moved appt to 06/18/21 with Dr. Gardiner Rhyme.

## 2022-05-16 NOTE — Telephone Encounter (Signed)
Pt would like a callback regarding appt on 06/05/22. Pt also has questions regarding recent Cath. Please advise

## 2022-05-16 NOTE — Telephone Encounter (Signed)
Checked with DOD, interventional cardiologist -- OK to drive today   Patient notified of this.

## 2022-05-16 NOTE — Progress Notes (Addendum)
Anesthesia Review:  PCP: Lanier Prude, PA  Cardiologist : Oswaldo Milian 05/08/22- LOV  Chest x-ray : 04/06/22- 2 view  EKG : 05/09/22  Monitor- 05/13/22  Echo : 05/07/22  Stress test: 2011  Cardiac Cath :  05/13/22  Activity level: can do a flight of stairs without difficulty  Sleep Study/ CPAP : none  Fasting Blood Sugar :      / Checks Blood Sugar -- times a day:   Blood Thinner/ Instructions /Last Dose: ASA / Instructions/ Last Dose :    Hx of WPW.  Saw Dr Rayann Heman in 2011.     In ED on 04/06/22 with pleuritic chest pain.  Sent to cardiology for followup.    Also diagnosed with shingles  around same time as cardiac workjp.    PT states she never had a rash.  Only burning sensation from right lower back to right side.  Placed on antiviral.  Unsure of when diagnosed with shingles.  All at the same time of cardiac workup.  PT reports no problems at preop .    PT allergic to hibiclens.  To shower with Dial soap.       No data to display

## 2022-05-16 NOTE — Patient Instructions (Signed)
SURGICAL WAITING ROOM VISITATION Patients having surgery or a procedure may have no more than 2 support people in the waiting area - these visitors may rotate.   Children under the age of 49 must have an adult with them who is not the patient. If the patient needs to stay at the hospital during part of their recovery, the visitor guidelines for inpatient rooms apply. Pre-op nurse will coordinate an appropriate time for 1 support person to accompany patient in pre-op.  This support person may not rotate.    Please refer to the [facility] website for the visitor guidelines for Inpatients (after your surgery is over and you are in a regular room).       Your procedure is scheduled on:  05/30/22    Report to [facility] Main Entrance    Report to admitting at   [phone]   Call this number if you have problems the morning of surgery [phone]   Do not eat food or drink liquids  :After Midnight.                           If you have questions, please contact your surgeon's office.     Oral Hygiene is also important to reduce your risk of infection.                                    Remember - BRUSH YOUR TEETH THE MORNING OF SURGERY WITH YOUR REGULAR TOOTHPASTE  DENTURES WILL BE REMOVED PRIOR TO SURGERY PLEASE DO NOT APPLY "Poly grip" OR ADHESIVES!!!   Do NOT smoke after Midnight   Take these medicines the morning of surgery with A SIP OF WATER:  none   DO NOT TAKE ANY ORAL DIABETIC MEDICATIONS DAY OF YOUR SURGERY  Bring CPAP mask and tubing day of surgery.                              You may not have any metal on your body including hair pins, jewelry, and body piercing             Do not wear make-up, lotions, powders, perfumes/cologne, or deodorant  Do not wear nail polish including gel and S&S, artificial/acrylic nails, or any other type of covering on natural nails including finger and toenails. If you have artificial nails, gel coating, etc. that needs to be  removed by a nail salon please have this removed prior to surgery or surgery may need to be canceled/ delayed if the surgeon/ anesthesia feels like they are unable to be safely monitored.   Do not shave  48 hours prior to surgery.               Men may shave face and neck.   Do not bring valuables to the hospital. [place].   Contacts, glasses, dentures or bridgework may not be worn into surgery.   Bring small overnight bag day of surgery.   DO NOT [place]. PHARMACY WILL DISPENSE MEDICATIONS LISTED ON YOUR MEDICATION LIST TO YOU DURING YOUR ADMISSION [place]!    Patients discharged on the day of surgery will not be allowed to drive home.  Someone NEEDS to stay with you for the first 24 hours after anesthesia.   Special Instructions: Bring a copy of your healthcare power of attorney and living will documents the day of surgery if you haven't scanned them before.              Please read over the following fact sheets you were given: IF [place] [phone]   If you received a COVID test during your pre-op visit  it is requested that you wear a mask when out in public, stay away from anyone that may not be feeling well and notify your surgeon if you develop symptoms. If you test positive for Covid or have been in contact with anyone that has tested positive in the last 10 days please notify you surgeon.    [place] - Preparing for Surgery Before surgery, you can play an important role.  Because skin is not sterile, your skin needs to be as free of germs as possible.  You can reduce the number of germs on your skin by washing with CHG (chlorahexidine gluconate) soap before surgery.  CHG is an antiseptic cleaner which kills germs and bonds with the skin to continue killing germs even after washing. Please DO NOT use if you have an allergy to CHG  or antibacterial soaps.  If your skin becomes reddened/irritated stop using the CHG and inform your nurse when you arrive at Short Stay. Do not shave (including legs and underarms) for at least 48 hours prior to the first CHG shower.  You may shave your face/neck. Please follow these instructions carefully:  1.  Shower with CHG Soap the night before surgery and the  morning of Surgery.  2.  If you choose to wash your hair, wash your hair first as usual with your  normal  shampoo.  3.  After you shampoo, rinse your hair and body thoroughly to remove the  shampoo.                           4.  Use CHG as you would any other liquid soap.  You can apply chg directly  to the skin and wash                       Gently with a scrungie or clean washcloth.  5.  Apply the CHG Soap to your body ONLY FROM THE NECK DOWN.   Do not use on face/ open                           Wound or open sores. Avoid contact with eyes, ears mouth and genitals (private parts).                       Wash face,  Genitals (private parts) with your normal soap.             6.  Wash thoroughly, paying special attention to the area where your surgery  will be performed.  7.  Thoroughly rinse your body with warm water from the neck down.  8.  DO NOT shower/wash with your normal soap after using and rinsing off  the CHG Soap.                9.  Pat yourself dry with a clean towel.  10.  Wear clean pajamas.            11.  Place clean sheets on your bed the night of your first shower and do not  sleep with pets. Day of Surgery : Do not apply any lotions/deodorants the morning of surgery.  Please wear clean clothes to the hospital/surgery center.  FAILURE TO FOLLOW THESE INSTRUCTIONS MAY RESULT IN THE CANCELLATION OF YOUR SURGERY PATIENT SIGNATURE_________________________________  NURSE SIGNATURE__________________________________  ________________________________________________________________________

## 2022-05-20 ENCOUNTER — Encounter (HOSPITAL_COMMUNITY): Payer: Self-pay

## 2022-05-20 ENCOUNTER — Encounter (HOSPITAL_COMMUNITY)
Admission: RE | Admit: 2022-05-20 | Discharge: 2022-05-20 | Disposition: A | Discharge status: Home / Self Care | Payer: Medicare Other | Source: Ambulatory Visit | Attending: Urology | Admitting: Urology

## 2022-05-20 ENCOUNTER — Other Ambulatory Visit: Payer: Self-pay

## 2022-05-20 DIAGNOSIS — F17210 Nicotine dependence, cigarettes, uncomplicated: Secondary | ICD-10-CM | POA: Diagnosis not present

## 2022-05-20 DIAGNOSIS — N200 Calculus of kidney: Secondary | ICD-10-CM | POA: Diagnosis not present

## 2022-05-20 DIAGNOSIS — Z01812 Encounter for preprocedural laboratory examination: Secondary | ICD-10-CM | POA: Insufficient documentation

## 2022-05-20 HISTORY — DX: Nausea with vomiting, unspecified: Z98.890

## 2022-05-20 HISTORY — DX: Other specified postprocedural states: R11.2

## 2022-05-20 LAB — BASIC METABOLIC PANEL
Anion gap: 6 (ref 5–15)
BUN: 14 mg/dL (ref 8–23)
CO2: 26 mmol/L (ref 22–32)
Calcium: 9.4 mg/dL (ref 8.9–10.3)
Chloride: 102 mmol/L (ref 98–111)
Creatinine, Ser: 0.75 mg/dL (ref 0.44–1.00)
GFR, Estimated: >60 mL/min (ref >60)
Glucose, Bld: 96 mg/dL (ref 70–99)
Potassium: 4.4 mmol/L (ref 3.5–5.1)
Sodium: 134 mmol/L — ABNORMAL LOW (ref 135–145)

## 2022-05-20 LAB — CBC
HCT: 41.8 % (ref 36.0–46.0)
Hemoglobin: 13.8 g/dL (ref 12.0–15.0)
MCH: 30.3 pg (ref 26.0–34.0)
MCHC: 33 g/dL (ref 30.0–36.0)
MCV: 91.7 fL (ref 80.0–100.0)
Platelets: 390 10*3/uL (ref 150–400)
RBC: 4.56 MIL/uL (ref 3.87–5.11)
RDW: 13.2 % (ref 11.5–15.5)
WBC: 8.9 10*3/uL (ref 4.0–10.5)
nRBC: 0 % (ref 0.0–0.2)

## 2022-05-21 ENCOUNTER — Other Ambulatory Visit: Payer: Self-pay | Admitting: Obstetrics and Gynecology

## 2022-05-21 DIAGNOSIS — Z1231 Encounter for screening mammogram for malignant neoplasm of breast: Secondary | ICD-10-CM

## 2022-05-21 LAB — URINE CULTURE: Culture: NO GROWTH

## 2022-05-21 NOTE — Progress Notes (Signed)
Anesthesia Chart Review   Case: [id number] Date/Time: 05/30/22 0715   Procedures:      URETEROSCOPY/HOLMIUM LASER/STONE EXTRACTION/STENT PLACEMENT (Left) - 210 MINUTES NEEDED FOR CASE     POSSIBLE NEPHROLITHOTOMY PERCUTANEOUS/ SURGEON ACCESS (Left)   Anesthesia type: General   Pre-op diagnosis: LEFT UPPER POLE KIDNEY STONE   Location: [facility] / [facility]   Surgeons: Ardis Hughs, MD       DISCUSSION:66 y.o. some day smoker with h/o PONV, WPW, left kidney stones scheduled for above procedure 05/30/2022 with Dr. Louis Meckel.   Pt seen by cardiology 05/08/2022 for evaluation of chest pain.  Per OV note, "Preop evaluation: Prior to surgery for kidney stones, currently scheduled for 12/28.  Given positive stress test, plan cardiac catheterization as above.  Discussed that if stent is placed would need to delay surgery until can stop Plavix in 6 months "  Cardiac cath 05/13/2022 with normal coronary arteries.   Anticipate pt can proceed with planned procedure barring acute status change.   VS: BP 134/81   Pulse 75   Temp 36.4 C (Oral)   Resp 16   Ht '5\' 1"'$  (1.549 m)   Wt 45.4 kg   SpO2 99%   BMI 18.89 kg/m   PROVIDERS: Manfred Shirts, PA  Oswaldo Milian, MD is Cardiologist  LABS: Labs reviewed: Acceptable for surgery. (all labs ordered are listed, but only abnormal results are displayed)  Labs Reviewed  BASIC METABOLIC PANEL - Abnormal; Notable for the following components:      Result Value   Sodium 134 (*)    All other components within normal limits  URINE CULTURE  CBC     IMAGES:   EKG:   CV: Cardiac cath 05/13/2022 IMPRESSION: Lauren Sanchez had normal coronary arteries and normal filling pressures.  Her 2D echo revealed normal LV function.  I believe her chest pain was noncardiac and her stress echo was false positive.  The sheath will be removed and pressure held on the groin to achieve hemostasis.  The patient left in stable condition.  She  will be discharged home later today as an outpatient and will follow-up with Dr. Gardiner Rhyme.  Past Medical History:  Diagnosis Date   Allergy    very mild with pollen , itchy eyes    Arthritis    slight in fingers    Complication of anesthesia    with surgery in 12/2015 - felt draggy for several days    Family history of anesthesia complication    PT STATES HER MOTHER IS 90 YRS OLD - BUT HAD SOME PROBLEM IN HER 20'S OR 30'S WHEN PUT UNDER FOR ANESTHESIA .  PT PLANS TO CALL HER MOTHER AND ASK HER IF SHE HAS MORE DETAILS.   History of kidney stones 03/17/2013   STATES KIDNEY STONE FOR PAST 2 MONTHS - CAUSING A LOT OF PAIN AND N&V-UNABLE TO PASS STONE.  PREVIOUS HX OF STONES   Hx of adenomatous polyp of colon    Hyperlipidemia    IBS (irritable bowel syndrome)    Gluten Free currently 04-25-2020  - IBS in her 20's    PONV (postoperative nausea and vomiting)    Thyroid disease    nodule-gluten free diet and thyroid blood studies w/in normal past 8 monts- is followed by endocrinologist   WPW (Wolff-Parkinson-White syndrome)    DOCUMENTED ON EKG'S DATING BACK TO 2011--PT SAW CARDIOLOGIST DR. ALLRED IN 2011 - HAD STRESS TEST "NONDIAGNOSTIC FOR ISCHEMIA DUE TO PRE-EXCITATION"  "  NO ARRHYTHMIAS DURING EXERCISE".  PT STATES  NOT AWARE OF ANY PROBLEMS FROM WPW - HER LAST EKG WAS 08/25/12 WITH DR. Etter Sjogren SHOWING WPW SYNDROME    Past Surgical History:  Procedure Laterality Date   COLONOSCOPY     CYSTOSCOPY WITH RETROGRADE PYELOGRAM, URETEROSCOPY AND STENT PLACEMENT Left 02/23/2016   Procedure: CYSTOSCOPY WITH LEFT RETROGRADE PYELOGRAM, LEFT URETEROSCOPY  AND LEFT STENT PLACEMENT;  Surgeon: Ardis Hughs, MD;  Location: [facility];  Service: Urology;  Laterality: Left;   CYSTOSCOPY WITH RETROGRADE PYELOGRAM, URETEROSCOPY AND STENT PLACEMENT Left 03/23/2020   Procedure: CYSTOSCOPY WITH RETROGRADE PYELOGRAM, LEFT URETEROSCOPY HOLMIUM LASER AND STENT PLACEMENT;  Surgeon: Ardis Hughs, MD;  Location:  [facility];  Service: Urology;  Laterality: Left;   CYSTOSCOPY WITH URETEROSCOPY, STONE BASKETRY AND STENT PLACEMENT Right 03/18/2013   Procedure: RIGHT URETEROSCOPY,  STONE REMOVAL AND STENT PLACEMENT, retrograde;  Surgeon: Ardis Hughs, MD;  Location: [facility];  Service: Urology;  Laterality: Right;   DILATION AND CURETTAGE OF UTERUS     x 2   HEMORRHOID BANDING     Dr Rosana Hoes- done in [place] office   HYSTEROSCOPY     12/2015- in epic under [place] CATH AND CORONARY ANGIOGRAPHY N/A 05/13/2022   Procedure: LEFT HEART CATH AND CORONARY ANGIOGRAPHY;  Surgeon: Lorretta Harp, MD;  Location: [facility];  Service: Cardiovascular;  Laterality: N/A;    MEDICATIONS:  Brimonidine Tartrate (LUMIFY OP)   cholecalciferol (VITAMIN D3) 25 MCG (1000 UNIT) tablet   Magnesium 300 MG CAPS   Omega-3 Fatty Acids (FISH OIL) 1000 MG CAPS   Polyethylene Glycol 400 (BLINK TEARS OP)   UNABLE TO FIND   No current facility-administered medications for this encounter.     Lauren Felix Ward, PA-C [facility] [phone]

## 2022-05-23 ENCOUNTER — Telehealth: Payer: Self-pay | Admitting: Cardiology

## 2022-05-23 NOTE — Telephone Encounter (Signed)
Patient stated that she recently had a cardiac cath procedure and she is concerned that her platelets have been higher than normal.  Patient stated her platelets have increased from 270 to 390.  Patient would like a call back to know if this is normal for after the procedure.  Patient stated that she has an appointment today at 2:15-4:00 pm today and can be reached before or after that time or can leave a message in her voicemail.

## 2022-05-23 NOTE — Telephone Encounter (Signed)
FYI: Patient stated she was concerned that her plt count is up, but within normal range. She wanted to know if it was related to the cardiac cath. She is having renal stone removal 12/28. Cardiac cath normal. Patient needs reassurance.

## 2022-05-24 ENCOUNTER — Other Ambulatory Visit: Payer: Self-pay | Admitting: Obstetrics and Gynecology

## 2022-05-24 DIAGNOSIS — N6323 Unspecified lump in the left breast, lower outer quadrant: Secondary | ICD-10-CM

## 2022-05-24 IMAGING — US US BREAST*L* LIMITED INC AXILLA
1 series · 9 of 9 positions shown · non-contrast
Comparison: Previous exam(s).

CLINICAL DATA: Patient describes a palpable lump at or just below
the level of the inframammary fold of the LEFT breast.

EXAM:
ULTRASOUND OF THE LEFT INFRAMAMMARY FOLD/UPPER ABDOMINAL WALL

[Series 1: us breast*left* limited inc axilla · 0.05mm/px · 9 of 9 slices shown]
[im 1/9]
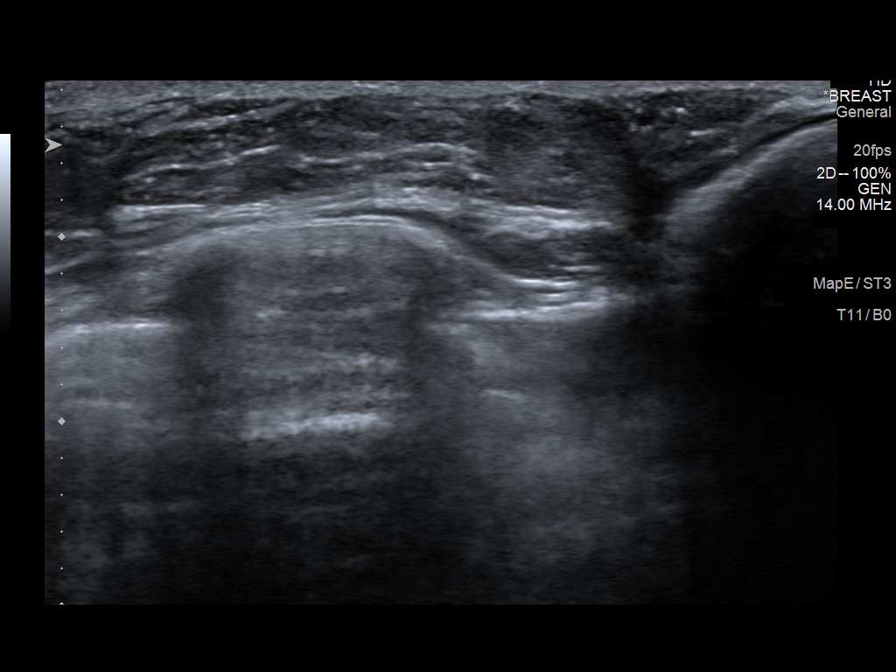
[im 2/9]
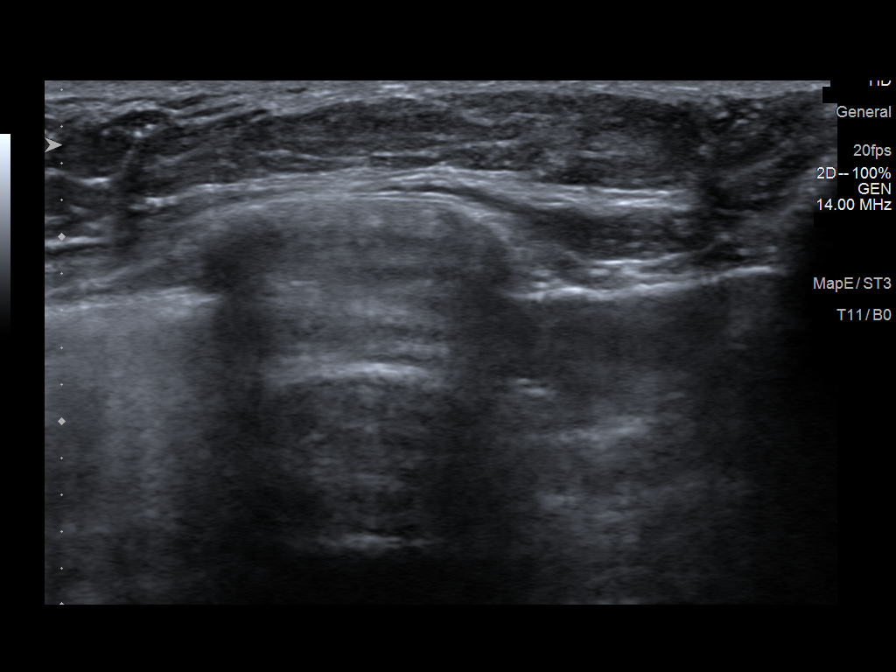
[im 3/9]
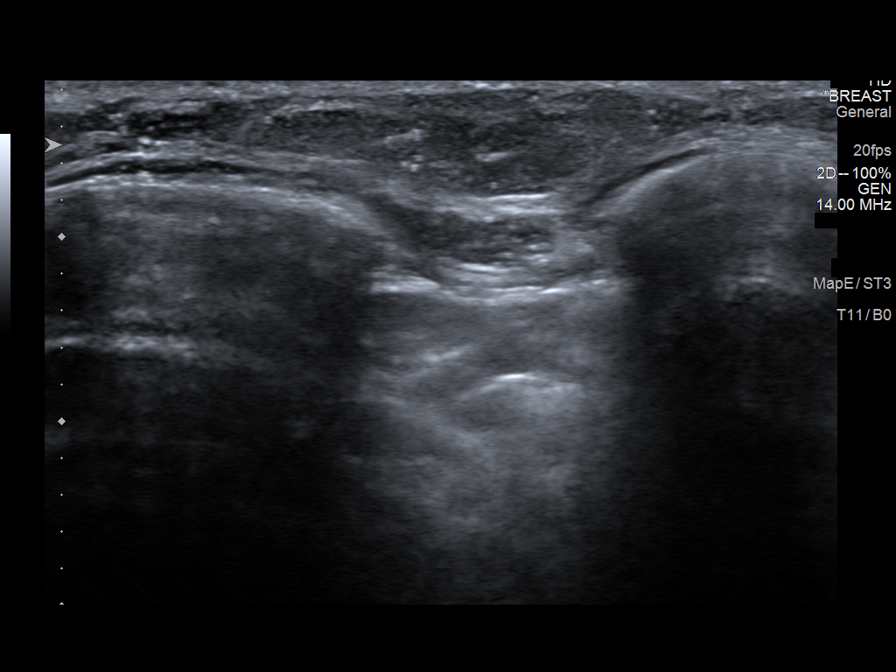
[im 4/9]
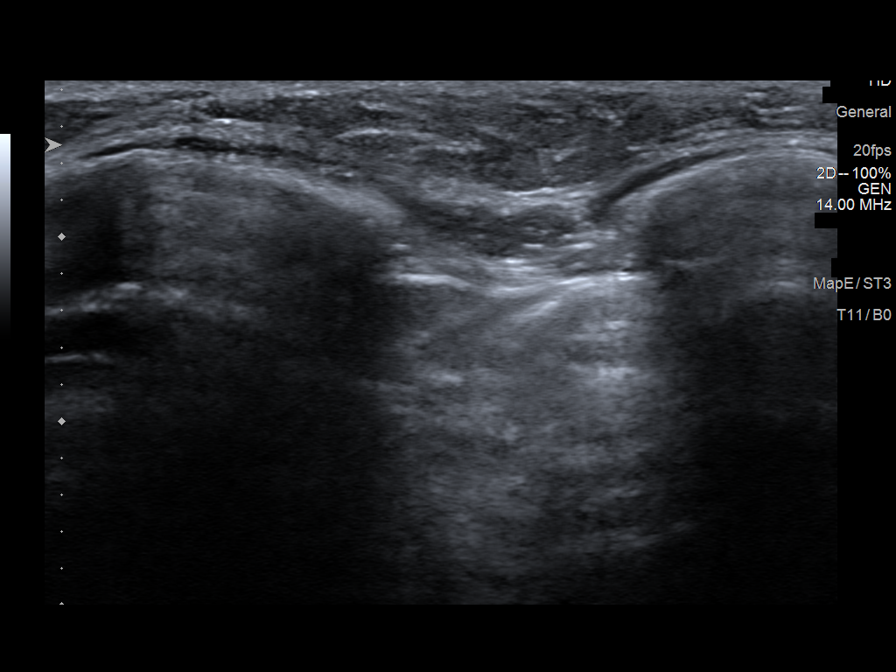
[im 5/9]
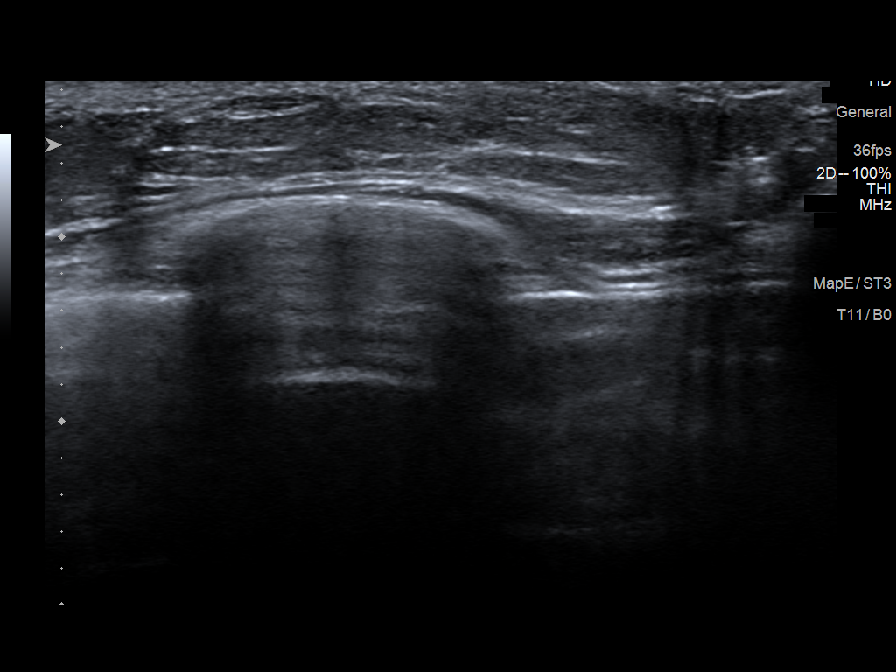
[im 6/9]
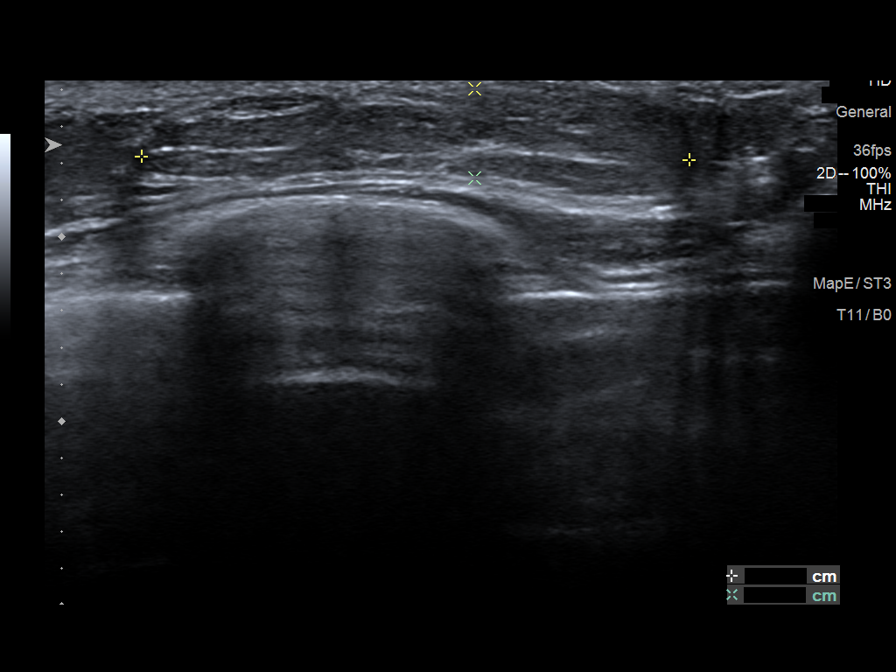
[im 7/9]
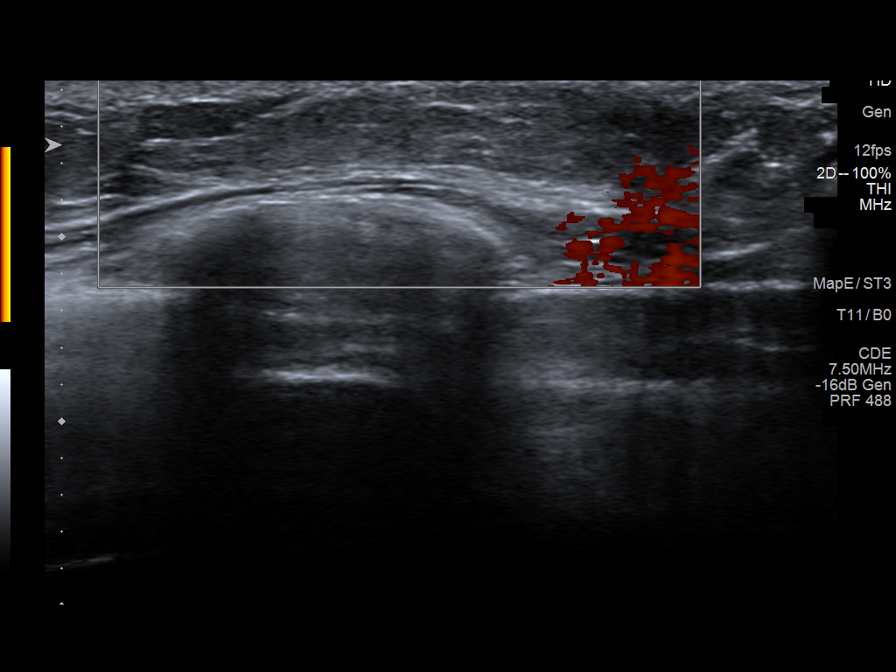
[im 8/9]
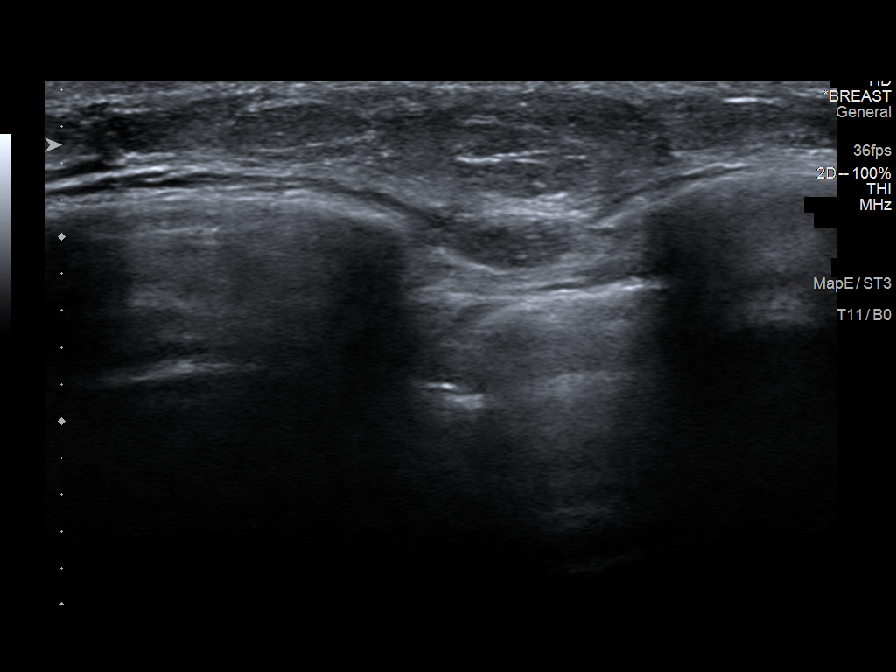
[im 9/9]
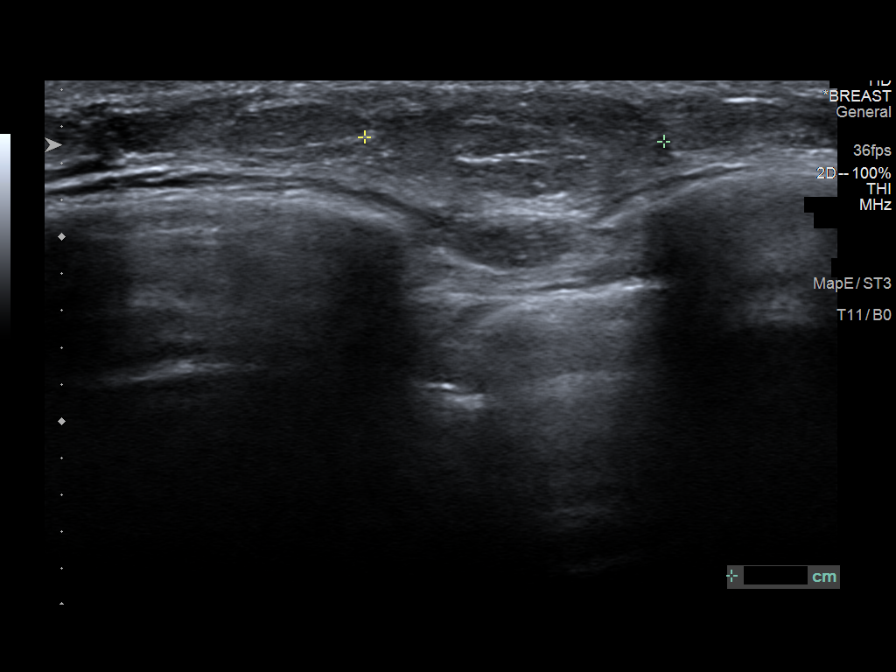

[9 of 9 positions shown; findings below may reference images not displayed]

FINDINGS: On physical exam, there is vague thickening within the inframammary
fold region of the LEFT breast, but no circumscribed mass.

Targeted ultrasound is performed, evaluating the inframammary fold
region and superficial soft tissues of the upper abdomen as directed
by the patient, showing no suspicious solid or cystic mass.

Sonographer initially identified a questionable lipoma within the
inframammary fold region, 6 o'clock axis, measuring 3 cm. However, I
was not able to convincing reproduce a discrete lipomatous mass in
this area during my subsequent real-time ultrasound evaluation, with
only normal soft tissues throughout.
IMPRESSION: No evidence of malignancy and no definite lipoma at the palpable
area of concern.

RECOMMENDATION:
1. Clinical follow-up. The patient was instructed to return for
additional imaging if the area that she feels becomes larger and/or
firmer to palpation, or if a new palpable abnormality is identified
in either breast.
2. Otherwise, annual screening mammograms. Next bilateral screening
mammogram will be due in [REDACTED]/Monday May, 2021.

I have discussed the findings and recommendations with the patient.
If applicable, a reminder letter will be sent to the patient
regarding the next appointment.

BI-RADS CATEGORY  1: Negative.

## 2022-05-24 NOTE — Telephone Encounter (Signed)
Platelet count is normal and there is nothing to worry about

## 2022-05-29 ENCOUNTER — Ambulatory Visit (HOSPITAL_BASED_OUTPATIENT_CLINIC_OR_DEPARTMENT_OTHER): Payer: Medicare Other

## 2022-05-29 NOTE — Telephone Encounter (Signed)
Spoke to patient, aware of MD recommendations.  She states she is concerned due to it being in the 300s.  Reports her level has always been in the 200s.  She is scheduled for her procedure tomorrow.   She states she spoke to surgeon as well and they weren't concerned.    Advised to call back with additional questions or concerns.

## 2022-05-30 ENCOUNTER — Encounter (HOSPITAL_COMMUNITY): Payer: Self-pay | Admitting: Urology

## 2022-05-30 ENCOUNTER — Other Ambulatory Visit (HOSPITAL_COMMUNITY): Payer: Self-pay | Admitting: Urology

## 2022-05-30 ENCOUNTER — Ambulatory Visit (HOSPITAL_COMMUNITY)
Admission: RE | Admit: 2022-05-30 | Discharge: 2022-05-30 | Disposition: A | Discharge status: Home / Self Care | Payer: Medicare Other | Attending: Urology | Admitting: Urology

## 2022-05-30 ENCOUNTER — Ambulatory Visit (HOSPITAL_BASED_OUTPATIENT_CLINIC_OR_DEPARTMENT_OTHER): Payer: Medicare Other | Admitting: Anesthesiology

## 2022-05-30 ENCOUNTER — Encounter (HOSPITAL_COMMUNITY)
Admission: RE | Disposition: A | Discharge status: Home / Self Care | Payer: Self-pay | Source: Home / Self Care | Attending: Urology

## 2022-05-30 ENCOUNTER — Other Ambulatory Visit: Payer: Self-pay

## 2022-05-30 ENCOUNTER — Ambulatory Visit (HOSPITAL_COMMUNITY): Payer: Medicare Other | Admitting: Physician Assistant

## 2022-05-30 ENCOUNTER — Ambulatory Visit (HOSPITAL_COMMUNITY): Payer: Medicare Other

## 2022-05-30 DIAGNOSIS — Z419 Encounter for procedure for purposes other than remedying health state, unspecified: Secondary | ICD-10-CM

## 2022-05-30 DIAGNOSIS — Z87442 Personal history of urinary calculi: Secondary | ICD-10-CM | POA: Insufficient documentation

## 2022-05-30 DIAGNOSIS — M1990 Unspecified osteoarthritis, unspecified site: Secondary | ICD-10-CM | POA: Insufficient documentation

## 2022-05-30 DIAGNOSIS — N200 Calculus of kidney: Secondary | ICD-10-CM

## 2022-05-30 DIAGNOSIS — F17210 Nicotine dependence, cigarettes, uncomplicated: Secondary | ICD-10-CM | POA: Insufficient documentation

## 2022-05-30 DIAGNOSIS — N201 Calculus of ureter: Secondary | ICD-10-CM | POA: Diagnosis present

## 2022-05-30 HISTORY — PX: CYSTOSCOPY/URETEROSCOPY/HOLMIUM LASER/STENT PLACEMENT: SHX6546

## 2022-05-30 SURGERY — CYSTOSCOPY/URETEROSCOPY/HOLMIUM LASER/STENT PLACEMENT
Anesthesia: General | Laterality: Left

## 2022-05-30 MED ORDER — ONDANSETRON HCL 4 MG/2ML IJ SOLN
INTRAMUSCULAR | Status: DC | PRN
  Administered 2022-05-30: 4 mg via INTRAVENOUS

## 2022-05-30 MED ORDER — PHENAZOPYRIDINE HCL 200 MG PO TABS: 200.0000 mg | ORAL_TABLET | Freq: Three times a day (TID) | ORAL | 0 refills | Status: AC | PRN

## 2022-05-30 MED ORDER — MIDAZOLAM HCL 2 MG/2ML IJ SOLN
INTRAMUSCULAR | Status: AC
  Filled 2022-05-30: qty 2

## 2022-05-30 MED ORDER — DEXAMETHASONE SODIUM PHOSPHATE 10 MG/ML IJ SOLN
INTRAMUSCULAR | Status: AC
  Filled 2022-05-30: qty 1

## 2022-05-30 MED ORDER — HYDROMORPHONE HCL 1 MG/ML IJ SOLN: 0.2500 mg | INTRAMUSCULAR | Status: DC | PRN

## 2022-05-30 MED ORDER — FENTANYL CITRATE (PF) 100 MCG/2ML IJ SOLN
INTRAMUSCULAR | Status: DC | PRN
  Administered 2022-05-30: 50 ug via INTRAVENOUS

## 2022-05-30 MED ORDER — AMISULPRIDE (ANTIEMETIC) 5 MG/2ML IV SOLN: 10.0000 mg | Freq: Once | INTRAVENOUS | Status: DC | PRN

## 2022-05-30 MED ORDER — PHENYLEPHRINE 80 MCG/ML (10ML) SYRINGE FOR IV PUSH (FOR BLOOD PRESSURE SUPPORT)
PREFILLED_SYRINGE | INTRAVENOUS | Status: AC
  Filled 2022-05-30: qty 10

## 2022-05-30 MED ORDER — EPHEDRINE SULFATE-NACL 50-0.9 MG/10ML-% IV SOSY
PREFILLED_SYRINGE | INTRAVENOUS | Status: DC | PRN
  Administered 2022-05-30: 10 mg via INTRAVENOUS

## 2022-05-30 MED ORDER — ROCURONIUM BROMIDE 10 MG/ML (PF) SYRINGE
PREFILLED_SYRINGE | INTRAVENOUS | Status: AC
  Filled 2022-05-30: qty 10

## 2022-05-30 MED ORDER — ROCURONIUM BROMIDE 10 MG/ML (PF) SYRINGE
PREFILLED_SYRINGE | INTRAVENOUS | Status: DC | PRN
  Administered 2022-05-30: 60 mg via INTRAVENOUS

## 2022-05-30 MED ORDER — LIDOCAINE HCL (PF) 2 % IJ SOLN
INTRAMUSCULAR | Status: AC
  Filled 2022-05-30: qty 5

## 2022-05-30 MED ORDER — CIPROFLOXACIN IN D5W 400 MG/200ML IV SOLN
400.0000 mg | INTRAVENOUS | Status: AC
  Administered 2022-05-30: 400 mg via INTRAVENOUS
  Filled 2022-05-30: qty 200

## 2022-05-30 MED ORDER — OXYCODONE HCL 5 MG/5ML PO SOLN: 5.0000 mg | Freq: Once | ORAL | Status: AC | PRN

## 2022-05-30 MED ORDER — SODIUM CHLORIDE 0.9 % IR SOLN
Status: DC | PRN
  Administered 2022-05-30: 6000 mL via INTRAVESICAL

## 2022-05-30 MED ORDER — ONDANSETRON HCL 4 MG/2ML IJ SOLN
INTRAMUSCULAR | Status: AC
  Filled 2022-05-30: qty 2

## 2022-05-30 MED ORDER — MIDAZOLAM HCL 5 MG/5ML IJ SOLN
INTRAMUSCULAR | Status: DC | PRN
  Administered 2022-05-30: 2 mg via INTRAVENOUS

## 2022-05-30 MED ORDER — OXYCODONE HCL 5 MG PO TABS
5.0000 mg | ORAL_TABLET | Freq: Once | ORAL | Status: AC | PRN
  Administered 2022-05-30: 5 mg via ORAL

## 2022-05-30 MED ORDER — OXYCODONE HCL 5 MG PO TABS
ORAL_TABLET | ORAL | Status: AC
  Filled 2022-05-30: qty 1

## 2022-05-30 MED ORDER — LACTATED RINGERS IV SOLN: INTRAVENOUS | Status: DC

## 2022-05-30 MED ORDER — PHENYLEPHRINE 80 MCG/ML (10ML) SYRINGE FOR IV PUSH (FOR BLOOD PRESSURE SUPPORT)
PREFILLED_SYRINGE | INTRAVENOUS | Status: DC | PRN
  Administered 2022-05-30: 80 ug via INTRAVENOUS
  Administered 2022-05-30: 160 ug via INTRAVENOUS

## 2022-05-30 MED ORDER — IOHEXOL 300 MG/ML  SOLN
INTRAMUSCULAR | Status: DC | PRN
  Administered 2022-05-30: 17 mL

## 2022-05-30 MED ORDER — LIDOCAINE 2% (20 MG/ML) 5 ML SYRINGE
INTRAMUSCULAR | Status: DC | PRN
  Administered 2022-05-30: 60 mg via INTRAVENOUS

## 2022-05-30 MED ORDER — PROMETHAZINE HCL 25 MG/ML IJ SOLN: 6.2500 mg | INTRAMUSCULAR | Status: DC | PRN

## 2022-05-30 MED ORDER — TRAMADOL HCL 50 MG PO TABS: 50.0000 mg | ORAL_TABLET | Freq: Four times a day (QID) | ORAL | 0 refills | Status: AC | PRN

## 2022-05-30 MED ORDER — CEFAZOLIN SODIUM-DEXTROSE 2-4 GM/100ML-% IV SOLN
2.0000 g | INTRAVENOUS | Status: AC
  Administered 2022-05-30: 2 g via INTRAVENOUS
  Filled 2022-05-30: qty 100

## 2022-05-30 MED ORDER — PROPOFOL 10 MG/ML IV BOLUS
INTRAVENOUS | Status: DC | PRN
  Administered 2022-05-30: 140 mg via INTRAVENOUS

## 2022-05-30 MED ORDER — EPHEDRINE 5 MG/ML INJ
INTRAVENOUS | Status: AC
  Filled 2022-05-30: qty 5

## 2022-05-30 MED ORDER — BUPIVACAINE-EPINEPHRINE (PF) 0.5% -1:200000 IJ SOLN
INTRAMUSCULAR | Status: AC
  Filled 2022-05-30: qty 30

## 2022-05-30 MED ORDER — FENTANYL CITRATE (PF) 100 MCG/2ML IJ SOLN
INTRAMUSCULAR | Status: AC
  Filled 2022-05-30: qty 2

## 2022-05-30 MED ORDER — MEPERIDINE HCL 50 MG/ML IJ SOLN: 6.2500 mg | INTRAMUSCULAR | Status: DC | PRN

## 2022-05-30 MED ORDER — SUGAMMADEX SODIUM 200 MG/2ML IV SOLN
INTRAVENOUS | Status: DC | PRN
  Administered 2022-05-30: 200 mg via INTRAVENOUS

## 2022-05-30 MED ORDER — DEXAMETHASONE SODIUM PHOSPHATE 10 MG/ML IJ SOLN
INTRAMUSCULAR | Status: DC | PRN
  Administered 2022-05-30: 8 mg via INTRAVENOUS

## 2022-05-30 MED ORDER — PROPOFOL 10 MG/ML IV BOLUS
INTRAVENOUS | Status: AC
  Filled 2022-05-30: qty 20

## 2022-05-30 MED ORDER — 0.9 % SODIUM CHLORIDE (POUR BTL) OPTIME
TOPICAL | Status: DC | PRN
  Administered 2022-05-30: 1000 mL

## 2022-05-30 SURGICAL SUPPLY — 69 items
APL PRP STRL LF DISP 70% ISPRP (MISCELLANEOUS)
APL SKNCLS STERI-STRIP NONHPOA (GAUZE/BANDAGES/DRESSINGS)
BAG COUNTER SPONGE SURGICOUNT (BAG) IMPLANT
BAG DRN RND TRDRP ANRFLXCHMBR (UROLOGICAL SUPPLIES)
BAG SPNG CNTER NS LX DISP (BAG)
BAG URINE DRAIN 2000ML AR STRL (UROLOGICAL SUPPLIES) IMPLANT
BAG URO CATCHER STRL LF (MISCELLANEOUS) ×1 IMPLANT
BASKET STONE NCOMPASS (UROLOGICAL SUPPLIES) IMPLANT
BASKET ZERO TIP NITINOL 2.4FR (BASKET) IMPLANT
BENZOIN TINCTURE PRP APPL 2/3 (GAUZE/BANDAGES/DRESSINGS) ×1 IMPLANT
BLADE SURG 15 STRL LF DISP TIS (BLADE) ×1 IMPLANT
BLADE SURG 15 STRL SS (BLADE)
BOOTIES KNEE HIGH SLOAN (MISCELLANEOUS) IMPLANT
BSKT STON RTRVL ZERO TP 2.4FR (BASKET)
CATH 2WAY 30CC 24FR (CATHETERS) IMPLANT
CATH URETERAL DUAL LUMEN 10F (MISCELLANEOUS) ×1 IMPLANT
CATH URETL OPEN 5X70 (CATHETERS) ×1 IMPLANT
CATH UROLOGY TORQUE 65 (CATHETERS) IMPLANT
CATH X-FORCE N30 NEPHROSTOMY (TUBING) ×1 IMPLANT
CHLORAPREP W/TINT 26 (MISCELLANEOUS) ×1 IMPLANT
CLOTH BEACON ORANGE TIMEOUT ST (SAFETY) ×1 IMPLANT
DRAPE C-ARM 42X120 X-RAY (DRAPES) ×1 IMPLANT
DRAPE LINGEMAN PERC (DRAPES) ×1 IMPLANT
DRAPE SURG IRRIG POUCH 19X23 (DRAPES) ×2 IMPLANT
DRSG TEGADERM 8X12 (GAUZE/BANDAGES/DRESSINGS) ×2 IMPLANT
EXTRACTOR STONE 1.7FRX115CM (UROLOGICAL SUPPLIES) IMPLANT
GAUZE PAD ABD 8X10 STRL (GAUZE/BANDAGES/DRESSINGS) IMPLANT
GAUZE SPONGE 4X4 12PLY STRL (GAUZE/BANDAGES/DRESSINGS) IMPLANT
GLOVE SURG LX STRL 7.5 STRW (GLOVE) ×1 IMPLANT
GOWN STRL REUS W/ TWL XL LVL3 (GOWN DISPOSABLE) ×1 IMPLANT
GOWN STRL REUS W/TWL XL LVL3 (GOWN DISPOSABLE) ×1
GUIDEWIRE AMPLAZ .035X145 (WIRE) ×2 IMPLANT
GUIDEWIRE ANG ZIPWIRE 038X150 (WIRE) IMPLANT
GUIDEWIRE STR DUAL SENSOR (WIRE) ×1 IMPLANT
HLDR NDL AMPLATZ W/INSERTS (MISCELLANEOUS) IMPLANT
HOLDER NEEDLE AMPLATZ W/INSERT (MISCELLANEOUS) IMPLANT
IV SET EXTENSION CATH 6 NF (IV SETS) ×1 IMPLANT
KIT BASIN OR (CUSTOM PROCEDURE TRAY) ×1 IMPLANT
KIT PROBE 340X3.4XDISP GRN (MISCELLANEOUS) IMPLANT
KIT PROBE TRILOGY 3.4X340 (MISCELLANEOUS)
KIT PROBE TRILOGY 3.9X350 (MISCELLANEOUS) IMPLANT
LASER FIB FLEXIVA PULSE ID 365 (Laser) IMPLANT
MANIFOLD NEPTUNE II (INSTRUMENTS) ×1 IMPLANT
MAT ABSORB  FLUID 56X50 GRAY (MISCELLANEOUS)
MAT ABSORB FLUID 56X50 GRAY (MISCELLANEOUS) ×1 IMPLANT
NDL SPNL 20GX3.5 QUINCKE YW (NEEDLE) IMPLANT
NDL TROCAR 18X15 ECHO (NEEDLE) IMPLANT
NDL TROCAR 18X20 (NEEDLE) IMPLANT
NEEDLE SPNL 20GX3.5 QUINCKE YW (NEEDLE) IMPLANT
NEEDLE TROCAR 18X15 ECHO (NEEDLE) IMPLANT
NEEDLE TROCAR 18X20 (NEEDLE) IMPLANT
NS IRRIG 1000ML POUR BTL (IV SOLUTION) ×1 IMPLANT
PACK CYSTO (CUSTOM PROCEDURE TRAY) ×1 IMPLANT
SHEATH NAVIGATOR HD 12/14X28 (SHEATH) IMPLANT
SHEATH NAVIGATOR HD 12/14X36 (SHEATH) IMPLANT
SHEATH PEELAWAY SET 9 (SHEATH) IMPLANT
SPONGE T-LAP 4X18 ~~LOC~~+RFID (SPONGE) ×1 IMPLANT
STENT URET 6FRX24 CONTOUR (STENTS) IMPLANT
SUT ETHILON 3 0 PS 1 (SUTURE) IMPLANT
SYR 10ML LL (SYRINGE) ×1 IMPLANT
SYR 20ML LL LF (SYRINGE) ×2 IMPLANT
SYR 50ML LL SCALE MARK (SYRINGE) ×1 IMPLANT
TOWEL OR 17X26 10 PK STRL BLUE (TOWEL DISPOSABLE) ×1 IMPLANT
TRACTIP FLEXIVA PULS ID 200XHI (Laser) IMPLANT
TRACTIP FLEXIVA PULSE ID 200 (Laser) ×1
TRAY FOLEY MTR SLVR 16FR STAT (SET/KITS/TRAYS/PACK) ×1 IMPLANT
TUBING CONNECTING 10 (TUBING) ×2 IMPLANT
TUBING STONE CATCHER TRILOGY (MISCELLANEOUS) IMPLANT
TUBING UROLOGY SET (TUBING) ×1 IMPLANT

## 2022-05-30 NOTE — Op Note (Signed)
Preoperative diagnosis:  Large stone in the left upper pole moiety with gross hematuria and intermittent flank pain  Postoperative diagnosis:  Same  Procedure: Cystoscopy, retrograde pyelogram with interpretation Left ureteroscopy, laser lithotripsy and stent placement  Surgeon: Ardis Hughs, MD  Anesthesia: General  Complications: None  Intraoperative findings:  #1: The patient has a completely duplicated left kidney.  The opening for the left upper pole moiety is the lower ureteral orifice. #2: Retrograde pyelograms were performed in both moieties.  The lower pole moiety demonstrated no hydroureteronephrosis or abnormalities.  The upper pole moiety demonstrated no hydroureteronephrosis but there was a filling defect in the lower calyx consistent with the patient's known stone. #3: The stone was dusted, several of the larger fragments were removed for stone composition analysis, the remainder of the fragments were too small to basket and will easily pass.  EBL: Stone was sent for composition analysis  Specimens: None  Indication: Lauren Sanchez is a 66 y.o. patient with history of nephrolithiasis who presented to my office several months ago with gross hematuria and intermittent left-sided flank pain.  Repeat CT scan demonstrated a 1 cm stone in the left upper pole moiety..  After reviewing the management options for treatment, he elected to proceed with the above surgical procedure(s). We have discussed the potential benefits and risks of the procedure, side effects of the proposed treatment, the likelihood of the patient achieving the goals of the procedure, and any potential problems that might occur during the procedure or recuperation. Informed consent has been obtained.  Description of procedure:  The patient was taken to the operating room and general anesthesia was induced.  The patient was placed in the dorsal lithotomy position, prepped and draped in the usual sterile  fashion, and preoperative antibiotics were administered. A preoperative time-out was performed.   21 French 30 degree cystoscope was gently passed through the patient's urethra under visual guidance.  Cystoscopy was performed demonstrating to the ureteral orifice on the patient's left-hand side.  There were no other abnormalities.  The upper orifice was cannulated and retrograde pyelogram was performed with the above findings.  I subsequently withdrew the open-ended catheter and cannulated the lower orifice and performed retrograde pyelogram on the upper pole moiety.  The above findings were then noted.  Subsequently advanced a wire up through the open-ended catheter and into the upper pole moiety under fluoroscopic guidance.  I then removed the open-ended catheter and drained the patient's bladder.  I then advanced a dual-lumen catheter up over the wire and advanced it up to the UPJ of the upper moiety.  I then advanced a second wire up through there and remove the access sheath.  I then took the inner portion of a 12/14 [place] ureteral access sheath, 38 cm, and advanced it up to the left UPJ under visual guidance.  Subsequently remove that and passed both the inner and outer portion of the access sheath over the second wire under fluoroscopic guidance.  Once it was noted to be in the proximal ureter I remove the inner portion of the access sheath and the wire.  I then advanced a flexible ureteroscope through the sheath and into the upper pole moiety.  The stone was quickly encountered.  I used 200 m laser fiber and dusted the stone.  There were several fragments that I was able to cross with the basket and sent those for stone analysis.  The remainder of the fragments I attempted to irrigate out through the sheath.  What was remaining in the renal pelvis was very small and easily to be passed.  I subsequently removed the scope and the sheath simultaneously inspecting the ureter noting no significant ureteral  trauma.  I then advanced the stent over the wire and into the left upper pole moiety under fluoroscopic guidance.  Once it was noted to be well within the renal pelvis I advanced it up to the urethral meatus before removing the wire.  The distal curl was noted to curl nicely in the patient's bladder.  I then used the beak of the cystoscopic sheath to drain the patient's bladder.  The stent tether was pulled through the urethra and tucked into her vagina.  The patient was subsequently extubated and returned to PACU in stable condition.  Disposition: The patient be scheduled for follow-up in 5 days to remove her stent.  It does have his stent tether, which should make stent removal easy.  Ardis Hughs, M.D.

## 2022-05-30 NOTE — Anesthesia Preprocedure Evaluation (Signed)
Anesthesia Evaluation  Patient identified by MRN, date of birth, ID band Patient awake    Reviewed: Allergy & Precautions, NPO status , Patient's Chart, lab work & pertinent test results  History of Anesthesia Complications (+) PONV and history of anesthetic complications  Airway Mallampati: II  TM Distance: >3 FB Neck ROM: Full    Dental no notable dental hx.    Pulmonary Current Smoker and Patient abstained from smoking.   Pulmonary exam normal breath sounds clear to auscultation       Cardiovascular Normal cardiovascular exam Rhythm:Regular Rate:Normal  WPW   Neuro/Psych negative neurological ROS  negative psych ROS   GI/Hepatic negative GI ROS, Neg liver ROS,,,  Endo/Other  negative endocrine ROS    Renal/GU Renal disease  negative genitourinary   Musculoskeletal  (+) Arthritis , Osteoarthritis,    Abdominal   Peds negative pediatric ROS (+)  Hematology negative hematology ROS (+)   Anesthesia Other Findings   Reproductive/Obstetrics negative OB ROS                             Anesthesia Physical Anesthesia Plan  ASA: II  Anesthesia Plan: General   Post-op Pain Management: Dilaudid IV and Ofirmev IV (intra-op)*   Induction: Intravenous  PONV Risk Score and Plan: 3 and Ondansetron, Dexamethasone, Treatment may vary due to age or medical condition and Midazolam  Airway Management Planned: Oral ETT  Additional Equipment:   Intra-op Plan:   Post-operative Plan: Extubation in OR  Informed Consent: I have reviewed the patients History and Physical, chart, labs and discussed the procedure including the risks, benefits and alternatives for the proposed anesthesia with the patient or authorized representative who has indicated his/her understanding and acceptance.     Dental advisory given  Plan Discussed with: CRNA and Surgeon  Anesthesia Plan Comments:          Anesthesia Quick Evaluation

## 2022-05-30 NOTE — H&P (Signed)
Pt presents today for pre-operative history and physical exam in anticipation of cysto, left ureteroscopy/lithotripsy/stone retrieval/ureteral stent placement vs possible PCNL with surgeon access by Dr. Louis Meckel on 05/30/22. She is doing well and is without complaint.   Pt was evaled in ED approx 1 month ago for left sided chest pain with deep inspiration. She was diagnosed with pleurisy and followed up with cardiology. She had a negative cardiac cath performed by Dr Gwenlyn Found on 05/13/22.   Pt denies F/C, HA, CP, SOB, N/V, diarrhea/constipation, back pain, flank pain, hematuria, and dysuria.    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 She denies unilateral flank pain, passage of stone material, fevers, chills, vomiting. She reports that she does not feel well and she has some increased fatigue. She she was seen by her primary care physician who sent her over for further evaluation to to negative urine culture and microscopic hematuria.   Today, she reports that she is not feeling 100% however she denies fevers and chills. She does endorse fatigue as well as some pelvic discomfort and increased urinary frequency. Her urine does have trace amount of blood. She denies flank pain and dysuria. Stone analysis was calcium ox, uric acid.   3/21: Today the patient is seen 6 weeks postop from ureteroscopy. She had an ultrasound prior to her appointment today. Fortunately, the patient's pain is completely resolved. She has been meeting with the dietitian to work on stone prevention the. She denies any hematuria. She denies any dysuria. She denies any flank pain.   Interval: Today the patient follows up after completing a 24 hour urine  collection. She is attempting to modify with a Lauren Sanchez risk factors 3 dietary measures. She performed a 24 hour urine collection, and is here to go over the results.   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 She presents today with an episode of gross hematuria that began sometime last week. She has not had anything that she describes as pain but has had intermittent and persistent left flank discomfort for over a month. She denies fevers and chills. She is having urinary urgency and frequency. She states her urine has cleared up over the last 2 days and she is no longer seeing blood. She is an occasional cigarette smoker.   Today the patient is here to discuss treatment options. She has had some significant right-sided flank pain and nausea vomiting 2 days ago. This seems to have gotten somewhat better. She has had intermittent left-sided flank pain. She had a CAT scan performed at her last office visit demonstrating a large nonobstructing stone in the left upper pole moiety.     ALLERGIES: Shrimp - eye swelling Tamsulosin - fainting    MEDICATIONS: Fish Oil  Magnesium  OTC Cholesterol Medication  Vitamin B Complex  Vitamin D3 50 mcg (2,000 unit) tablet Oral     Notes: *over the counter supplement to lower cholesterol    GU PSH: Cysto Remove Stent FB Sim - 2017 Cysto Uretero Lithotripsy - 2014 Cystoscopy Insert Stent, Left - 2017, 2014 Cystoscopy  Ureteroscopy, Left - 2017 Ureteroscopic laser litho, Left - 2021       PSH Notes: Cystoscopy With Ureteroscopy With Lithotripsy, Cystoscopy With Insertion Of Ureteral Stent Right, No Surgical Problems   NON-GU PSH: None   GU PMH: Gross hematuria - 03/21/2022, - 03/19/2022, - 2019 Renal and ureteral calculus - 03/21/2022, - 03/19/2022, - 2021, - 2021, - 2017 (Stable,  Acute), Left, The patient has a large stone in the left proximal ureter that is unlikely to pass on its own. I recommended definitive management versus medical expulsion therapy., - 2017 History of urolithiasis - 2022, History of renal calculi, - 2014 Ureteral calculus - 2021, - 2021, Left ureteral calculus, - 2015 Other microscopic hematuria, Microscopic hematuria - 2015    NON-GU PMH: Leiomyoma of uterus, unspecified, Fibroid, uterine - 2015 Encounter for general adult medical examination without abnormal findings, Encounter for preventive health examination - 2014 Personal history of other endocrine, nutritional and metabolic disease, History of hypothyroidism - 2014, History of hypercholesterolemia, - 2014    FAMILY HISTORY: Diabetes - Father Family Health Status Number - Runs In Family nephrolithiasis - Father   SOCIAL HISTORY: Marital Status: Married Preferred Language: English; Ethnicity: Not Hispanic Or Latino; Race: White Current Smoking Status: Patient smokes.  Does not drink anymore.  Does not use drugs. Drinks 1 caffeinated drink per day. Has not had a blood transfusion.     Notes: smokes 3-4 cigarettes most days    REVIEW OF SYSTEMS:    GU Review Female:   Patient denies frequent urination, hard to postpone urination, burning /pain with urination, get up at night to urinate, leakage of urine, stream starts and stops, trouble starting your stream, have to strain to urinate, and being pregnant.  Gastrointestinal (Upper):   Patient denies nausea, vomiting, and indigestion/ heartburn.  Gastrointestinal (Lower):   Patient denies diarrhea and constipation.  Constitutional:   Patient denies fever, night sweats, weight loss, and fatigue.  Skin:   Patient denies itching and skin rash/ lesion.  Eyes:   Patient denies blurred vision and double vision.  Ears/ Nose/ Throat:   Patient denies sore throat and sinus problems.  Hematologic/Lymphatic:   Patient denies swollen glands and  easy bruising.  Cardiovascular:   Patient denies leg swelling and chest pains.  Respiratory:   Patient denies cough and shortness of breath.  Endocrine:   Patient denies excessive thirst.  Musculoskeletal:   Patient denies back pain and joint pain.  Neurological:   Patient denies headaches and dizziness.  Psychologic:   Patient denies depression and anxiety.   VITAL SIGNS:      05/14/2022 01:35 PM  Weight 102 lb / 46.27 kg  Height 61 in / 154.94 cm  BP 123/77 mmHg  Pulse 89 /min  Temperature 98.2 F / 36.7 C  BMI 19.3 kg/m   MULTI-SYSTEM PHYSICAL EXAMINATION:    Constitutional: Well-nourished. No physical deformities. Normally developed. Good grooming.  Neck: Neck symmetrical, not swollen. Normal tracheal position.  Respiratory: Normal breath sounds. No labored breathing, no use of accessory muscles.   Cardiovascular: Regular rate and rhythm. No murmur, no gallop.  Lymphatic: No enlargement of neck, axillae, groin.  Skin: No paleness, no jaundice, no cyanosis. No lesion, no ulcer, no rash.  Neurologic / Psychiatric: Oriented to time, oriented to place, oriented to person. No depression, no anxiety, no agitation.  Gastrointestinal: No mass, no tenderness, no rigidity, non obese abdomen.  Eyes: Normal conjunctivae. Normal eyelids.  Ears, Nose, Mouth, and Throat: Left ear  no scars, no lesions, no masses. Right ear no scars, no lesions, no masses. Nose no scars, no lesions, no masses. Normal hearing. Normal lips.  Musculoskeletal: Normal gait and station of head and neck.     Complexity of Data:  Records Review:   Previous Patient Records  Urine Test Review:   Urinalysis   05/14/22  Urinalysis  Urine Appearance Clear   Urine Specimen Voided   Urine Color Straw   Urine Glucose Neg   Urine Bilirubin Neg   Urine Ketones Neg   Urine Specific Gravity <= 1.005   Urine Blood Neg   Urine pH 5.5   Urine Protein Neg   Urine Urobilinogen 0.2   Urine Leukocyte Esterase Neg     PROCEDURES:          Urinalysis - 81003 Dipstick Dipstick Cont'd  Specimen: Voided Bilirubin: Neg  Color: Straw Ketones: Neg  Appearance: Clear Blood: Neg  Specific Gravity: <= 1.005 Protein: Neg  pH: 5.5 Urobilinogen: 0.2  Glucose: Neg Leukocyte Esterase: Neg    ASSESSMENT:      ICD-10 Details  1 GU:   Renal and ureteral calculus - N20.2    PLAN:           Schedule Return Visit/Planned Activity: Keep Scheduled Appointment - Schedule Surgery          Document Letter(s):  Created for Patient: Clinical Summary         Notes:   There are no changes in the patients history or physical exam since last evaluation by Dr. Louis Meckel. Pt is scheduled to undergo cysto, left ureteroscopy/lithotripsy/stent placement vs PCNL on 05/30/22.

## 2022-05-30 NOTE — Anesthesia Procedure Notes (Signed)
Procedure Name: Intubation Date/Time: 05/30/2022 7:20 AM  Performed by: Lind Covert, CRNAPre-anesthesia Checklist: Patient identified, Emergency Drugs available, Suction available, Patient being monitored and Timeout performed Patient Re-evaluated:Patient Re-evaluated prior to induction Oxygen Delivery Method: Circle system utilized Preoxygenation: Pre-oxygenation with 100% oxygen Induction Type: IV induction Ventilation: Mask ventilation without difficulty Laryngoscope Size: Mac and 3 Grade View: Grade II Tube type: Oral Tube size: 7.0 mm Number of attempts: 1 Airway Equipment and Method: Stylet Placement Confirmation: ETT inserted through vocal cords under direct vision, positive ETCO2 and breath sounds checked- equal and bilateral Secured at: 21 cm Tube secured with: Tape Dental Injury: Teeth and Oropharynx as per pre-operative assessment

## 2022-05-30 NOTE — Discharge Instructions (Signed)
DISCHARGE INSTRUCTIONS FOR KIDNEY STONE/URETERAL STENT   MEDICATIONS:  1.  Resume all your other meds from home - except do not take any extra narcotic pain meds that you may have at home.  2. Pyridium is to help with the burning/stinging when you urinate. 3. Tramadol is for moderate/severe pain, otherwise taking upto 1000 mg every 6 hours of plainTylenol will help treat your pain.      ACTIVITY:  1. No strenuous activity x 1week  2. No driving while on narcotic pain medications  3. Drink plenty of water  4. Continue to walk at home - you can still get blood clots when you are at home, so keep active, but don't over do it.  5. May return to work/school tomorrow or when you feel ready   BATHING:  1. You can shower and we recommend daily showers  2. You have a string coming from your urethra: The stent string is attached to your ureteral stent. Do not pull on this.   SIGNS/SYMPTOMS TO CALL:  Please call us if you have a fever greater than 101.5, uncontrolled nausea/vomiting, uncontrolled pain, dizziness, unable to urinate, bloody urine, chest pain, shortness of breath, leg swelling, leg pain, redness around wound, drainage from wound, or any other concerns or questions.   You can reach [place] at [phone].   FOLLOW-UP:  1. We will schedule you for f/u for stent removal on Tuesday.

## 2022-05-30 NOTE — Transfer of Care (Signed)
Immediate Anesthesia Transfer of Care Note  Patient: Lauren Sanchez  Procedure(s) Performed: URETEROSCOPY/HOLMIUM LASER/STONE EXTRACTION/STENT PLACEMENT (Left)  Patient Location: PACU  Anesthesia Type:General  Level of Consciousness: sedated  Airway & Oxygen Therapy: Patient Spontanous Breathing and Patient connected to face mask oxygen  Post-op Assessment: Report given to RN and Post -op Vital signs reviewed and stable  Post vital signs: Reviewed and stable  Last Vitals:  Vitals Value Taken Time  BP 130/82 05/30/22 0848  Temp    Pulse 79 05/30/22 0849  Resp 21 05/30/22 0849  SpO2 100 % 05/30/22 0849  Vitals shown include unvalidated device data.  Last Pain:  Vitals:   05/30/22 0613  TempSrc:   PainSc: 0-No pain         Complications: No notable events documented.

## 2022-05-30 NOTE — Interval H&P Note (Signed)
History and Physical Interval Note:  05/30/2022 7:10 AM  Lauren Sanchez  has presented today for surgery, with the diagnosis of LEFT UPPER POLE KIDNEY STONE.  The various methods of treatment have been discussed with the patient and family. After consideration of risks, benefits and other options for treatment, the patient has consented to  Procedure(s) with comments: URETEROSCOPY/HOLMIUM LASER/STONE EXTRACTION/STENT PLACEMENT (Left) - 210 MINUTES NEEDED FOR CASE POSSIBLE NEPHROLITHOTOMY PERCUTANEOUS/ SURGEON ACCESS (Left) as a surgical intervention.  The patient's history has been reviewed, patient examined, no change in status, stable for surgery.  I have reviewed the patient's chart and labs.  Questions were answered to the patient's satisfaction.     Ardis Hughs

## 2022-05-31 ENCOUNTER — Encounter (HOSPITAL_COMMUNITY): Payer: Self-pay | Admitting: Urology

## 2022-05-31 NOTE — Anesthesia Postprocedure Evaluation (Signed)
Anesthesia Post Note  Patient: Lauren Sanchez  Procedure(s) Performed: URETEROSCOPY/HOLMIUM LASER/STONE EXTRACTION/STENT PLACEMENT (Left)     Patient location during evaluation: PACU Anesthesia Type: General Level of consciousness: awake and alert Pain management: pain level controlled Vital Signs Assessment: post-procedure vital signs reviewed and stable Respiratory status: spontaneous breathing, nonlabored ventilation and respiratory function stable Cardiovascular status: blood pressure returned to baseline and stable Postop Assessment: no apparent nausea or vomiting Anesthetic complications: no   No notable events documented.  Last Vitals:  Vitals:   05/30/22 0930 05/30/22 0945  BP: 121/80 120/76  Pulse: 70 70  Resp: 13 20  Temp: 36.6 C 36.6 C  SpO2: 100% 98%    Last Pain:  Vitals:   05/30/22 0945  TempSrc: Oral  PainSc: 0-No pain                 Lynda Rainwater

## 2022-06-05 ENCOUNTER — Ambulatory Visit: Payer: Medicare Other | Admitting: Cardiology

## 2022-06-11 LAB — CALCULI, WITH PHOTOGRAPH (CLINICAL LAB)
Calcium Oxalate Dihydrate: 100 %
Weight Calculi: 33 mg

## 2022-06-12 ENCOUNTER — Ambulatory Visit
Admission: RE | Admit: 2022-06-12 | Discharge: 2022-06-12 | Disposition: A | Discharge status: Home / Self Care | Payer: Medicare Other | Source: Ambulatory Visit | Attending: Obstetrics and Gynecology | Admitting: Obstetrics and Gynecology

## 2022-06-12 DIAGNOSIS — N6323 Unspecified lump in the left breast, lower outer quadrant: Secondary | ICD-10-CM

## 2022-06-16 NOTE — Progress Notes (Unsigned)
Cardiology Office Note:    Date:  06/18/2022   ID:  Lauren Sanchez, DOB Aug 04, 1955, MRN [id number]  PCP:  Manfred Shirts, PA  Cardiologist:  None  Electrophysiologist:  None   Referring MD: Manfred Shirts, PA   No chief complaint on file.   History of Present Illness:    Lauren Sanchez is a 67 y.o. female with a hx of WPW, hyperlipidemia who presents for follow-up.  She was seen in the ED on 04/06/2022 with chest pain.  Troponin 17 > 23 > 17, D-dimer negative.  She saw Dr. Rayann Heman in 11/23/2009 for preexcitation seen on EKG.  No evidence of SVT.  Recommended GXT to evaluate conduction properties of her accessory pathway.  GXT 01/2010 was nondiagnostic for ischemia due to preexcitation, no arrhythmias during exercise.  She reports she had an episode this past summer where felt like heart was racing.  Lasted about 2 minutes.  Denies any recent palpitations, lightheadedness, or syncope.  She reports she was recently diagnosed with likely shingles and started on Valtrex.  Was having pain on right side of back.  When she presented to the ED on 04/06/2022 she had began having pain in left lower chest.  States that pain was constant and stabbing, worse with deep breathing.  Lasted for hours.  Does continue to have some pain since discharge from the ED but improving.  She walks daily for about 30 minutes, denies any exertional symptoms.  She smokes 3 to 4 cigarettes/day, was smoking up to 0.5 packs/day, and has smoked since her 2s.  Family history includes both parents had pacemakers but she is unsure of details.  Stress echocardiogram 05/06/2022 was intermediate to high risk study with stress-induced wall motion abnormalities as well as horizontal ST depressions in inferolateral leads.  Cardiac catheterization 05/13/2022 showed normal coronary arteries.  Zio patch x 14 days 05/13/2022 showed no significant arrhythmias.  Since last clinic visit, she reports that she is doing well.  States that her surgery for  kidney stones went well.  Reports chest pain has improved.  Denies any dyspnea, lightheadedness, syncope, lower extremity edema, or palpitations.  Continues to smoke 3 to 4 cigarettes/day.    Past Medical History:  Diagnosis Date   Allergy    very mild with pollen , itchy eyes    Arthritis    slight in fingers    Complication of anesthesia    with surgery in 12/2015 - felt draggy for several days    Family history of anesthesia complication    PT STATES HER MOTHER IS 90 YRS OLD - BUT HAD SOME PROBLEM IN HER 20'S OR 30'S WHEN PUT UNDER FOR ANESTHESIA .  PT PLANS TO CALL HER MOTHER AND ASK HER IF SHE HAS MORE DETAILS.   History of kidney stones 03/17/2013   STATES KIDNEY STONE FOR PAST 2 MONTHS - CAUSING A LOT OF PAIN AND N&V-UNABLE TO PASS STONE.  PREVIOUS HX OF STONES   Hx of adenomatous polyp of colon    Hyperlipidemia    IBS (irritable bowel syndrome)    Gluten Free currently 04-25-2020  - IBS in her 20's    PONV (postoperative nausea and vomiting)    Thyroid disease    nodule-gluten free diet and thyroid blood studies w/in normal past 8 monts- is followed by endocrinologist   WPW (Wolff-Parkinson-White syndrome)    DOCUMENTED ON EKG'S DATING BACK TO 2011--PT SAW CARDIOLOGIST DR. ALLRED IN 2011 - HAD STRESS TEST "NONDIAGNOSTIC FOR ISCHEMIA DUE  TO PRE-EXCITATION"  "NO ARRHYTHMIAS DURING EXERCISE".  PT STATES  NOT AWARE OF ANY PROBLEMS FROM WPW - HER LAST EKG WAS 08/25/12 WITH DR. Etter Sjogren SHOWING WPW SYNDROME    Past Surgical History:  Procedure Laterality Date   COLONOSCOPY     CYSTOSCOPY WITH RETROGRADE PYELOGRAM, URETEROSCOPY AND STENT PLACEMENT Left 02/23/2016   Procedure: CYSTOSCOPY WITH LEFT RETROGRADE PYELOGRAM, LEFT URETEROSCOPY  AND LEFT STENT PLACEMENT;  Surgeon: Ardis Hughs, MD;  Location: [facility];  Service: Urology;  Laterality: Left;   CYSTOSCOPY WITH RETROGRADE PYELOGRAM, URETEROSCOPY AND STENT PLACEMENT Left 03/23/2020   Procedure: CYSTOSCOPY WITH RETROGRADE  PYELOGRAM, LEFT URETEROSCOPY HOLMIUM LASER AND STENT PLACEMENT;  Surgeon: Ardis Hughs, MD;  Location: [facility];  Service: Urology;  Laterality: Left;   CYSTOSCOPY WITH URETEROSCOPY, STONE BASKETRY AND STENT PLACEMENT Right 03/18/2013   Procedure: RIGHT URETEROSCOPY,  STONE REMOVAL AND STENT PLACEMENT, retrograde;  Surgeon: Ardis Hughs, MD;  Location: [facility];  Service: Urology;  Laterality: Right;   CYSTOSCOPY/URETEROSCOPY/HOLMIUM LASER/STENT PLACEMENT Left 05/30/2022   Procedure: URETEROSCOPY/HOLMIUM LASER/STONE EXTRACTION/STENT PLACEMENT;  Surgeon: Ardis Hughs, MD;  Location: [facility];  Service: Urology;  Laterality: Left;  210 MINUTES NEEDED FOR CASE   DILATION AND CURETTAGE OF UTERUS     x 2   HEMORRHOID BANDING     Dr Rosana Hoes- done in [place] office   HYSTEROSCOPY     12/2015- in epic under [place] CATH AND CORONARY ANGIOGRAPHY N/A 05/13/2022   Procedure: LEFT HEART CATH AND CORONARY ANGIOGRAPHY;  Surgeon: Lorretta Harp, MD;  Location: [facility];  Service: Cardiovascular;  Laterality: N/A;    Current Medications: Current Meds  Medication Sig   Brimonidine Tartrate (LUMIFY OP) Apply 1 drop to eye daily. Both eyes   cholecalciferol (VITAMIN D3) 25 MCG (1000 UNIT) tablet Take 2,000 Units by mouth daily. Pt takes 2000IU daily    Magnesium 300 MG CAPS Take by mouth.   Omega-3 Fatty Acids (FISH OIL) 1000 MG CAPS Take by mouth daily.   Polyethylene Glycol 400 (BLINK TEARS OP) Apply 1 drop to eye daily as needed (dryness). Both eyes   UNABLE TO FIND Take 1 capsule by mouth daily. Cholestepure- Phytosterol Complex     Allergies:   Doxycycline, Gluten meal, Hibiclens [chlorhexidine gluconate], Neomycin-polymyxin-dexameth, Rapaflo [silodosin], Simvastatin, and Tamsulosin   Social History   Socioeconomic History   Marital status: Married    Spouse name: Not on file   Number of children: Not on file   Years of education:  Not on file   Highest education level: Not on file  Occupational History   Not on file  Tobacco Use   Smoking status: Some Days    Packs/day: 0.25    Years: 30.00    Total pack years: 7.50    Types: Cigarettes   Smokeless tobacco: Never  Vaping Use   Vaping Use: Never used  Substance and Sexual Activity   Alcohol use: Never   Drug use: No   Sexual activity: Yes  Other Topics Concern   Not on file  Social History Narrative   Not on file   Social Determinants of Health   Financial Resource Strain: Not on file  Food Insecurity: Not on file  Transportation Needs: Not on file  Physical Activity: Not on file  Stress: Not on file  Social Connections: Not on file     Family History: The patient's family history includes Arthritis in  her mother; Cataracts in her father; Colon cancer in her maternal grandmother; Colon polyps in her mother; Dementia in an other family member; Diabetes in her father; Diverticulitis in her father; Endometriosis in her sister; Glaucoma in her father; Heart disease in her father and mother; Heart murmur in her mother; Hypertension in her father; Lung cancer in an other family member; Macular degeneration in her mother; Osteopenia in her mother; Skin cancer in her father; Thyroid nodules in her sister. There is no history of Kidney disease, Liver disease, Esophageal cancer, Rectal cancer, or Stomach cancer.  ROS:   Please see the history of present illness.     All other systems reviewed and are negative.  EKGs/Labs/Other Studies Reviewed:    The following studies were reviewed today:   EKG:   04/11/2022: Normal sinus rhythm, rate 66, QRS 76 no evidence of preexcitation, QTc 402 05/08/2022: Normal sinus rhythm, rate 86, less than 1 mm ST depressions in leads V4-6  Recent Labs: 05/20/2022: BUN 14; Creatinine, Ser 0.75; Hemoglobin 13.8; Platelets 390; Potassium 4.4; Sodium 134  Recent Lipid Panel    Component Value Date/Time   CHOL 261 (H) 12/02/2012  0921   TRIG 106 12/02/2012 0921   HDL 71 12/02/2012 0921   CHOLHDL 3 07/08/2011 1014   VLDL 19.6 07/08/2011 1014   LDLCALC 169 (H) 12/02/2012 0921   LDLDIRECT 137.9 03/09/2010 1029    Physical Exam:    VS:  BP (!) 140/71   Pulse 82   Ht '5\' 1"'$  (1.549 m)   Wt 100 lb 6.4 oz (45.5 kg)   SpO2 99%   BMI 18.97 kg/m     Wt Readings from Last 3 Encounters:  06/18/22 100 lb 6.4 oz (45.5 kg)  05/30/22 99 lb 3.3 oz (45 kg)  05/20/22 100 lb (45.4 kg)     GEN:  Well nourished, well developed in no acute distress HEENT: Normal NECK: No JVD; No carotid bruits LYMPHATICS: No lymphadenopathy CARDIAC: RRR, no murmurs, rubs, gallops RESPIRATORY:  Clear to auscultation without rales, wheezing or rhonchi  ABDOMEN: Soft, non-tender, non-distended MUSCULOSKELETAL:  No edema; No deformity  SKIN: Warm and dry NEUROLOGIC:  Alert and oriented x 3 PSYCHIATRIC:  Normal affect   ASSESSMENT:    1. Chest pain of uncertain etiology   2. Hyperlipidemia, unspecified hyperlipidemia type   3. WPW (Wolff-Parkinson-White syndrome)   4. Tobacco use      PLAN:    Chest pain: Atypical in description.  Had discussed coronary CTA or Myoview but she did not want to have any radiation.  Switched to stress echocardiogram.  Stress echocardiogram 05/06/2022 was intermediate to high risk study with stress-induced wall motion abnormalities as well as horizontal ST depressions in inferolateral leads.  Cardiac catheterization 05/13/2022 showed normal coronary arteries.  WPW: She saw Dr. Rayann Heman in 11/23/2009 for preexcitation seen on EKG.  No evidence of SVT.  Recommended GXT to evaluate conduction properties of her accessory pathway.  GXT 01/2010 was nondiagnostic for ischemia due to preexcitation, no arrhythmias during exercise. -Recent EKGs recently show no preexcitation.  She does report episode of palpitations where feels like heart was racing.  Zio patch x 14 days 05/13/2022 showed no significant arrhythmias.  HLD:  LDL 159 on 04/09/2022.  She reports intolerance to multiple statins. Cardiac catheterization 05/13/2022 showed normal coronary arteries.  Discussed starting zertio but she wishes to hold off on any medications at this time.  Tobacco use: Counseled on the risk of tobacco use and cessation strongly  recommended   RTC in 6 months     Medication Adjustments/Labs and Tests Ordered: Current medicines are reviewed at length with the patient today.  Concerns regarding medicines are outlined above.  Orders Placed This Encounter  Procedures   Lipid panel   No orders of the defined types were placed in this encounter.   Patient Instructions  Medication Instructions:  Your physician recommends that you continue on your current medications as directed. Please refer to the Current Medication list given to you today.  *If you need a refill on your cardiac medications before your next appointment, please call your pharmacy*   Lab Work: Please return for FASTING labs in 6 months (Lipid)  Our in office lab hours are Monday-Friday 8:00-4:00, closed for lunch 12:45-1:45 pm.  No appointment needed.  [facility] locations:   [place] ([place]) - [address] [place] [address] - [address] [place] - [address] ([facility])  Follow-Up: At [facility], you and your health needs are our priority.  As part of our continuing mission to provide you with exceptional heart care, we have created designated Provider Care Teams.  These Care Teams include your primary Cardiologist (physician) and Advanced Practice Providers (APPs -  Physician Assistants and Nurse Practitioners) who all work together to provide  you with the care you need, when you need it.  We recommend signing up for the patient portal called "MyChart".  Sign up information is provided on this After Visit Summary.  MyChart is used to connect with patients for Virtual Visits (Telemedicine).  Patients are able to view lab/test results, encounter notes, upcoming appointments, etc.  Non-urgent messages can be sent to your provider as well.   To learn more about what you can do with MyChart, go to [email or web address].    Your next appointment:   6 month(s)  Provider:   Dr. Gardiner Rhyme    Signed, Donato Heinz, MD  06/18/2022 2:05 PM    [place]

## 2022-06-18 ENCOUNTER — Encounter: Payer: Self-pay | Admitting: Cardiology

## 2022-06-18 ENCOUNTER — Ambulatory Visit: Payer: Medicare Other | Attending: Cardiology | Admitting: Cardiology

## 2022-06-18 VITALS — BP 140/71 | HR 82 | Ht 61.0 in | Wt 100.4 lb

## 2022-06-18 DIAGNOSIS — Z720 Tobacco use: Secondary | ICD-10-CM | POA: Insufficient documentation

## 2022-06-18 DIAGNOSIS — R079 Chest pain, unspecified: Secondary | ICD-10-CM | POA: Insufficient documentation

## 2022-06-18 DIAGNOSIS — E785 Hyperlipidemia, unspecified: Secondary | ICD-10-CM

## 2022-06-18 DIAGNOSIS — I456 Pre-excitation syndrome: Secondary | ICD-10-CM | POA: Diagnosis not present

## 2022-06-18 NOTE — Patient Instructions (Signed)
Medication Instructions:  Your physician recommends that you continue on your current medications as directed. Please refer to the Current Medication list given to you today.  *If you need a refill on your cardiac medications before your next appointment, please call your pharmacy*   Lab Work: Please return for FASTING labs in 6 months (Lipid)  Our in office lab hours are Monday-Friday 8:00-4:00, closed for lunch 12:45-1:45 pm.  No appointment needed.  [facility] locations:   [place] ([place]) - [address] - [address] [place] - [address] ([facility])  Follow-Up: At [facility], you and your health needs are our priority.  As part of our continuing mission to provide you with exceptional heart care, we have created designated Provider Care Teams.  These Care Teams include your primary Cardiologist (physician) and Advanced Practice Providers (APPs -  Physician Assistants and Nurse Practitioners) who all work together to provide you with the care you need, when you need it.  We recommend signing up for the patient portal called "MyChart".  Sign up information is provided on this After Visit Summary.  MyChart is used to connect with patients for Virtual Visits (Telemedicine).  Patients are able to view lab/test results, encounter notes, upcoming appointments, etc.  Non-urgent messages can be sent to your provider as well.   To learn more about what you can do with MyChart, go to [email or web address].    Your next appointment:   6 month(s)  Provider:   Dr. Gardiner Rhyme

## 2022-07-15 ENCOUNTER — Ambulatory Visit: Payer: Medicare Other

## 2022-07-16 ENCOUNTER — Ambulatory Visit: Payer: Medicare Other | Admitting: Cardiology

## 2023-02-05 ENCOUNTER — Telehealth: Payer: Self-pay

## 2023-02-05 ENCOUNTER — Other Ambulatory Visit: Payer: Self-pay

## 2023-02-05 DIAGNOSIS — E785 Hyperlipidemia, unspecified: Secondary | ICD-10-CM

## 2023-02-05 DIAGNOSIS — R079 Chest pain, unspecified: Secondary | ICD-10-CM

## 2023-02-05 NOTE — Telephone Encounter (Signed)
Called patient left message on personal voice mail calling to schedule 6 month follow up with Dr.Schumann.Advised you will need a fasting lipid panel 1 week before appointment.Advised to call back to schedule.

## 2023-02-13 ENCOUNTER — Telehealth: Payer: Self-pay | Admitting: Cardiology

## 2023-02-13 NOTE — Telephone Encounter (Signed)
Patient states she was unaware of the 6 month appt that she had. She cancelled this one month after last visit. She states "I had a whole slew of test" States receiving a false positive test which led to other tests and was told her arteries were that of a 67 year old.  She states after her recheck found just had high cholesterol and nothing else. She feels not understanding need for statin if "there is nothing there".  She states not needing a cardiologist.  She does not understand the reason for all these test and nothing found.  She will follow up with her PCP.

## 2023-02-13 NOTE — Telephone Encounter (Signed)
Patient stated she did not want to do any further testing and will be followed by her PCP going forward.

## 2023-06-11 ENCOUNTER — Other Ambulatory Visit: Payer: Self-pay | Admitting: General Practice

## 2023-06-11 DIAGNOSIS — Z1231 Encounter for screening mammogram for malignant neoplasm of breast: Secondary | ICD-10-CM

## 2023-07-03 ENCOUNTER — Ambulatory Visit
Admission: RE | Admit: 2023-07-03 | Discharge: 2023-07-03 | Disposition: A | Discharge status: Home / Self Care | Payer: Medicare Other | Source: Ambulatory Visit | Attending: General Practice | Admitting: General Practice

## 2023-07-03 DIAGNOSIS — Z1231 Encounter for screening mammogram for malignant neoplasm of breast: Secondary | ICD-10-CM

## 2023-10-06 ENCOUNTER — Other Ambulatory Visit: Payer: Self-pay | Admitting: Surgery

## 2024-02-10 ENCOUNTER — Encounter (HOSPITAL_BASED_OUTPATIENT_CLINIC_OR_DEPARTMENT_OTHER): Payer: Self-pay | Admitting: Emergency Medicine

## 2024-02-10 ENCOUNTER — Emergency Department (HOSPITAL_BASED_OUTPATIENT_CLINIC_OR_DEPARTMENT_OTHER)

## 2024-02-10 ENCOUNTER — Emergency Department (HOSPITAL_BASED_OUTPATIENT_CLINIC_OR_DEPARTMENT_OTHER)
Admission: EM | Admit: 2024-02-10 | Discharge: 2024-02-10 | Disposition: A | Discharge status: Home / Self Care | Attending: Emergency Medicine | Admitting: Emergency Medicine

## 2024-02-10 ENCOUNTER — Other Ambulatory Visit: Payer: Self-pay

## 2024-02-10 DIAGNOSIS — R2241 Localized swelling, mass and lump, right lower limb: Secondary | ICD-10-CM

## 2024-02-10 DIAGNOSIS — M7989 Other specified soft tissue disorders: Secondary | ICD-10-CM | POA: Insufficient documentation

## 2024-02-10 DIAGNOSIS — M545 Low back pain, unspecified: Secondary | ICD-10-CM | POA: Diagnosis not present

## 2024-02-10 LAB — URINALYSIS, ROUTINE W REFLEX MICROSCOPIC
Bacteria, UA: NONE SEEN
Bilirubin Urine: NEGATIVE
Glucose, UA: NEGATIVE mg/dL
Ketones, ur: NEGATIVE mg/dL
Leukocytes,Ua: NEGATIVE
Nitrite: NEGATIVE
Protein, ur: NEGATIVE mg/dL
Specific Gravity, Urine: <1.005 — ABNORMAL LOW (ref 1.005–1.030)
pH: 5.5 (ref 5.0–8.0)

## 2024-02-10 LAB — BASIC METABOLIC PANEL WITH GFR
Anion gap: 12 (ref 5–15)
BUN: 18 mg/dL (ref 8–23)
CO2: 26 mmol/L (ref 22–32)
Calcium: 10.2 mg/dL (ref 8.9–10.3)
Chloride: 102 mmol/L (ref 98–111)
Creatinine, Ser: 0.67 mg/dL (ref 0.44–1.00)
GFR, Estimated: >60 mL/min (ref >60)
Glucose, Bld: 92 mg/dL (ref 70–99)
Potassium: 4.1 mmol/L (ref 3.5–5.1)
Sodium: 140 mmol/L (ref 135–145)

## 2024-02-10 LAB — CBC WITH DIFFERENTIAL/PLATELET
Abs Immature Granulocytes: 0.01 K/uL (ref 0.00–0.07)
Basophils Absolute: 0 K/uL (ref 0.0–0.1)
Basophils Relative: 1 %
Eosinophils Absolute: 0.1 K/uL (ref 0.0–0.5)
Eosinophils Relative: 2 %
HCT: 39.8 % (ref 36.0–46.0)
Hemoglobin: 13.4 g/dL (ref 12.0–15.0)
Immature Granulocytes: 0 %
Lymphocytes Relative: 32 %
Lymphs Abs: 1.9 K/uL (ref 0.7–4.0)
MCH: 30.6 pg (ref 26.0–34.0)
MCHC: 33.7 g/dL (ref 30.0–36.0)
MCV: 90.9 fL (ref 80.0–100.0)
Monocytes Absolute: 0.5 K/uL (ref 0.1–1.0)
Monocytes Relative: 8 %
Neutro Abs: 3.4 K/uL (ref 1.7–7.7)
Neutrophils Relative %: 57 %
Platelets: 227 K/uL (ref 150–400)
RBC: 4.38 MIL/uL (ref 3.87–5.11)
RDW: 12.9 % (ref 11.5–15.5)
WBC: 5.9 K/uL (ref 4.0–10.5)
nRBC: 0 % (ref 0.0–0.2)

## 2024-02-10 NOTE — Discharge Instructions (Signed)
 Today you were seen for back pain and a soft tissue mass.  I suspect your back pain is likely musculoskeletal in nature.  You may alternate taking Tylenol  and Motrin as well as using Lidoderm  patches as needed for pain.  Please follow-up with your primary care regarding the soft tissue mass for further evaluation and treatment.  Thank you for letting us  treat you today. After reviewing your labs and imaging, I feel you are safe to go home. Please follow up with your PCP in the next several days and provide them with your records from this visit. Return to the Emergency Room if pain becomes severe or symptoms worsen.

## 2024-02-10 NOTE — ED Notes (Signed)
 ED Provider at bedside.

## 2024-02-10 NOTE — ED Provider Notes (Signed)
 [facility] EMERGENCY DEPARTMENT AT [facility] Provider Note   CSN: [id number] Arrival date & time: 02/10/24  1726     Patient presents with: Back Pain   Lauren Sanchez is a 68 y.o. female.  Presents today for low back pain that began this morning after bending over.  Patient also reports lump on back of her right thigh that she is due to have an ultrasound in the morning to rule out DVT.  Patient contacted her primary care about her back pain who advised she come here and receive the ultrasound tonight.  Patient denies numbness, weakness, history of blood clot, shortness of breath, chest pain, leg swelling, loss of bowel or bladder control, saddle anesthesia, any other complaints at this time.    Back Pain      Prior to Admission medications   Medication Sig Start Date End Date Taking? Authorizing Provider  Brimonidine Tartrate (LUMIFY OP) Apply 1 drop to eye daily. Both eyes    [provider]  cholecalciferol (VITAMIN D3) 25 MCG (1000 UNIT) tablet Take 2,000 Units by mouth daily. Pt takes 2000IU daily     [provider]  Magnesium 300 MG CAPS Take by mouth.    [provider]  Omega-3 Fatty Acids (FISH OIL) 1000 MG CAPS Take by mouth daily.    [provider]  phenazopyridine  (PYRIDIUM ) 200 MG tablet Take 1 tablet (200 mg total) by mouth 3 (three) times daily as needed for pain. 05/30/22   Cam Morene ORN, MD  Polyethylene Glycol 400 (BLINK TEARS OP) Apply 1 drop to eye daily as needed (dryness). Both eyes    [provider]  traMADol  (ULTRAM ) 50 MG tablet Take 1-2 tablets (50-100 mg total) by mouth every 6 (six) hours as needed for moderate pain. 05/30/22   Cam Morene ORN, MD  UNABLE TO FIND Take 1 capsule by mouth daily. Cholestepure- Phytosterol Complex    [provider]    Allergies: Doxycycline, Gluten meal, Hibiclens [chlorhexidine  gluconate], Neomycin-polymyxin-dexameth, Rapaflo [silodosin], Simvastatin,  and Tamsulosin     Review of Systems  Musculoskeletal:  Positive for back pain.    Updated Vital Signs BP (!) 142/97   Pulse 77   Temp 98.1 F (36.7 C)   Resp 20   Ht 5' 1 (1.549 m)   Wt 45.5 kg   SpO2 100%   BMI 18.95 kg/m   Physical Exam Vitals and nursing note reviewed.  Constitutional:      General: She is not in acute distress.    Appearance: Normal appearance. She is well-developed.  HENT:     Head: Normocephalic and atraumatic.     Right Ear: External ear normal.     Left Ear: External ear normal.  Eyes:     Conjunctiva/sclera: Conjunctivae normal.  Cardiovascular:     Rate and Rhythm: Normal rate and regular rhythm.     Pulses: Normal pulses.     Heart sounds: Normal heart sounds. No murmur heard. Pulmonary:     Effort: Pulmonary effort is normal. No respiratory distress.     Breath sounds: Normal breath sounds.  Abdominal:     Palpations: Abdomen is soft.     Tenderness: There is no abdominal tenderness.  Musculoskeletal:        General: No swelling.     Cervical back: Neck supple.     Comments: Patient has approximately 3 cm in diameter mobile mass noted to the distal posterior right thigh.  Patient is neurovascularly intact with +2  dorsalis pedis pulses.  Patient denies tenderness to palpation, no erythema, ecchymosis, warmth, or induration on exam.  Patient also reports lumbar paraspinal back pain without radiation, tenderness to palpation, no bony deformity, step-off, warmth, or ecchymosis noted on exam.  Skin:    General: Skin is warm and dry.     Capillary Refill: Capillary refill takes less than 2 seconds.  Neurological:     General: No focal deficit present.     Mental Status: She is alert and oriented to person, place, and time.  Psychiatric:        Mood and Affect: Mood normal.     (all labs ordered are listed, but only abnormal results are displayed) Labs Reviewed  URINALYSIS, ROUTINE W REFLEX MICROSCOPIC - Abnormal; Notable for the  following components:      Result Value   Color, Urine COLORLESS (*)    Specific Gravity, Urine <1.005 (*)    Hgb urine dipstick SMALL (*)    All other components within normal limits  BASIC METABOLIC PANEL WITH GFR  CBC WITH DIFFERENTIAL/PLATELET    EKG: None  Radiology: US  RT LOWER EXTREM LTD SOFT TISSUE NON VASCULAR Result Date: 02/10/2024 CLINICAL DATA:  Right thigh mass EXAM: ULTRASOUND right LOWER EXTREMITY LIMITED TECHNIQUE: Ultrasound examination of the lower extremity soft tissues was performed in the area of clinical concern. COMPARISON:  None Available. FINDINGS: Targeted sonography of the right posterior thigh is performed in the region of clinical concern. Slightly echogenic solid smooth mass measuring 3.3 x 0.7 x 2.2 cm, appears intramuscular. IMPRESSION: 3.3 cm slightly echogenic solid mass in the region of clinical concern, appears intramuscular. This may represent a lipoma but is nonspecific by sonography. Further management will need to be based on clinical grounds. Electronically Signed   By: Luke Bun M.D.   On: 02/10/2024 19:46   US  Venous Img Lower Right (DVT Study) Result Date: 02/10/2024 CLINICAL DATA:  Right thigh mass EXAM: Right LOWER EXTREMITY VENOUS DOPPLER ULTRASOUND TECHNIQUE: Gray-scale sonography with compression, as well as color and duplex ultrasound, were performed to evaluate the deep venous system(s) from the level of the common femoral vein through the popliteal and proximal calf veins. COMPARISON:  None Available. FINDINGS: VENOUS Normal compressibility of the common femoral, superficial femoral, and popliteal veins, as well as the visualized calf veins. Visualized portions of profunda femoral vein and great saphenous vein unremarkable. No filling defects to suggest DVT on grayscale or color Doppler imaging. Doppler waveforms show normal direction of venous flow, normal respiratory plasticity and response to augmentation. Limited views of the contralateral  common femoral vein are unremarkable. OTHER None. Limitations: none IMPRESSION: Negative. Electronically Signed   By: Luke Bun M.D.   On: 02/10/2024 19:44     Procedures   Medications Ordered in the ED - No data to display                                  Medical Decision Making Amount and/or Complexity of Data Reviewed Labs: ordered. Radiology: ordered.   This patient presents to the ED for concern of back pain and mass behind right thigh differential diagnosis includes DVT, musculoskeletal pain, cauda equina syndrome, epidural abscess   Lab Tests:  I Ordered, and personally interpreted labs.  The pertinent results include: CBC and BMP unremarkable, urine with small hemoglobin and low specific gravity   Imaging Studies ordered:  I ordered imaging studies including right  DVT ultrasound and right lower extremity limited soft tissue ultrasound I independently visualized and interpreted imaging which showed negative for DVT, 3.3 cm slightly echogenic solid mass in the region of clinical concern, appears intramuscular.  May represent a lipoma but is nonspecific by sonography. I agree with the radiologist interpretation  Problem List / ED Course:  Considered for admission or further workup however patient's vital signs, physical exam, labs, and imaging are reassuring.  Patient soft tissue mass most likely lipoma given patient's history of similar.  Patient advised to follow-up with her primary care for further evaluation and treatment of this soft tissue mass.  Patient's back pain likely due to musculoskeletal pain.  Patient advised to use Lidoderm  patches, Motrin, and Tylenol  as needed for pain and to follow-up with her primary care if her symptoms persist.  Patient given return precautions.  I feel patient is safe for discharge at this time.     Final diagnoses:  Soft tissue mass  Low back pain without sciatica, unspecified back pain laterality, unspecified chronicity     ED Discharge Orders     None          Francis Ileana SAILOR, PA-C 02/10/24 2024    Pamella Ozell LABOR, DO 02/19/24 [phone]

## 2024-02-10 NOTE — ED Triage Notes (Signed)
 Pt via pov from home with lower back pain that started this morning. She states that she has no hx of the same. Pt is due for US  tomorrow for a lump on back of thigh. Pt a&o x 4; nad noted.

## 2024-02-10 NOTE — ED Notes (Signed)
 Reviewed discharge instructions and follow-up care with pt. Pt verbalized understanding and had no further questions. Pt exited ED without complications.

## 2024-06-25 ENCOUNTER — Telehealth: Payer: Self-pay | Admitting: Cardiology

## 2024-06-25 NOTE — Telephone Encounter (Signed)
 Pt requesting provider switch to Dr. Court. Please advice

## 2024-06-27 NOTE — Telephone Encounter (Signed)
 Okay with me

## 2024-07-28 ENCOUNTER — Ambulatory Visit: Admitting: Cardiovascular Disease
